# Patient Record
Sex: Female | Born: 1985 | Race: White | Hispanic: No | Marital: Married | Smoking: Former smoker
Health system: Southern US, Community
[De-identification: ages and names within clinical notes are randomized; demographics above are authoritative.]

## PROBLEM LIST (undated history)

## (undated) DIAGNOSIS — F329 Major depressive disorder, single episode, unspecified: Secondary | ICD-10-CM

## (undated) DIAGNOSIS — F32A Depression, unspecified: Secondary | ICD-10-CM

## (undated) DIAGNOSIS — E079 Disorder of thyroid, unspecified: Secondary | ICD-10-CM

## (undated) DIAGNOSIS — N200 Calculus of kidney: Secondary | ICD-10-CM

## (undated) DIAGNOSIS — F988 Other specified behavioral and emotional disorders with onset usually occurring in childhood and adolescence: Secondary | ICD-10-CM

## (undated) HISTORY — DX: Depression, unspecified: F32.A

## (undated) HISTORY — DX: Calculus of kidney: N20.0

## (undated) HISTORY — DX: Major depressive disorder, single episode, unspecified: F32.9

## (undated) HISTORY — DX: Other specified behavioral and emotional disorders with onset usually occurring in childhood and adolescence: F98.8

## (undated) HISTORY — DX: Disorder of thyroid, unspecified: E07.9

---
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
## 2005-03-09 ENCOUNTER — Other Ambulatory Visit: Admission: RE | Admit: 2005-03-09 | Discharge: 2005-03-09 | Payer: Self-pay | Admitting: Obstetrics and Gynecology

## 2005-03-09 ENCOUNTER — Other Ambulatory Visit: Admission: RE | Admit: 2005-03-09 | Discharge: 2005-03-09 | Payer: Self-pay | Admitting: Obstetrics & Gynecology

## 2006-01-12 ENCOUNTER — Emergency Department (HOSPITAL_COMMUNITY): Admission: EM | Admit: 2006-01-12 | Discharge: 2006-01-12 | Payer: Self-pay | Admitting: Emergency Medicine

## 2007-10-10 IMAGING — CT CT PELVIS W/O CM
1 of 3 series · 15 of 36 positions shown, 19 images · IV contrast (agent unspecified)
Comparison: none

CLINICAL DATA: Left flank pain.  Microhematuria for one day.  No previous surgery.  
 ABDOMEN CT WITHOUT CONTRAST:
TECHNIQUE: Multidetector CT imaging of the abdomen was performed following the standard protocol without IV contrast.
TECHNIQUE: Multidetector CT imaging of the pelvis was performed following the standard protocol without IV contrast.

[Series 2: renal stone · axial · 0.70mm/px · z∈[-441,-91]mm · 15 of 78 slices shown, 19 images]
[im 5/78  soft-tissue]
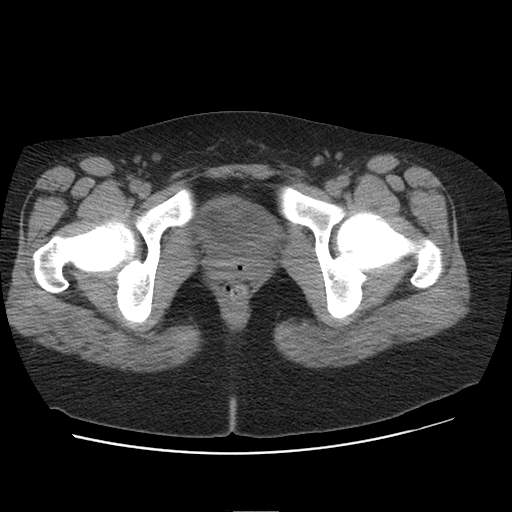
[im 5/78  bone]
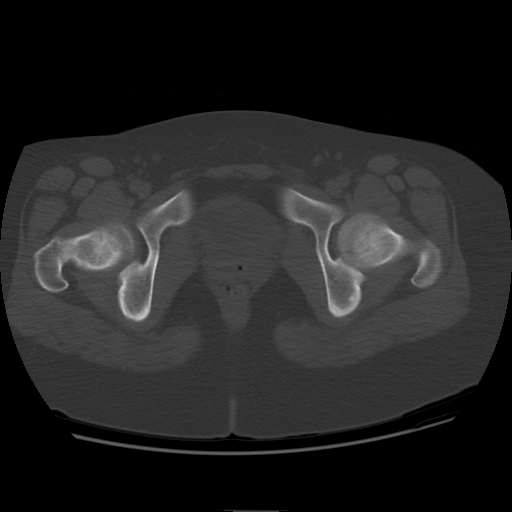
[im 10/78  soft-tissue]
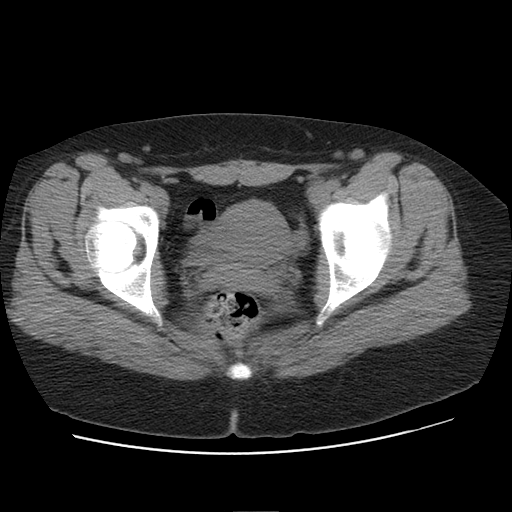
[im 15/78  soft-tissue]
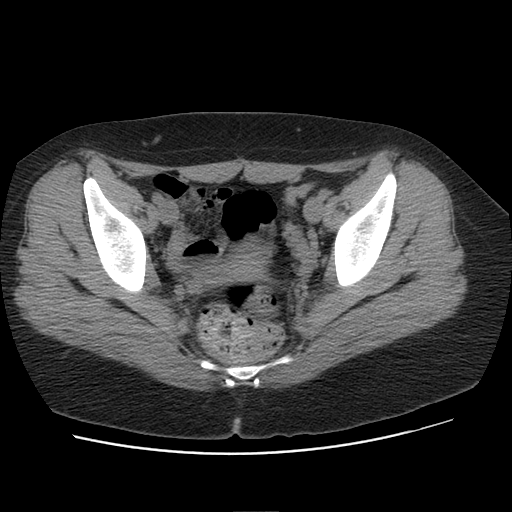
[im 23/78  soft-tissue]
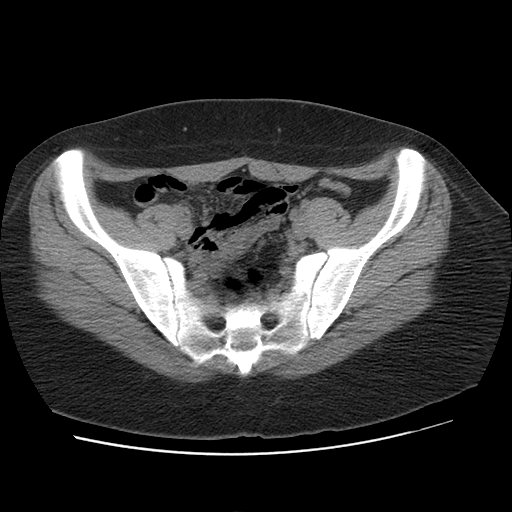
[im 28/78  soft-tissue]
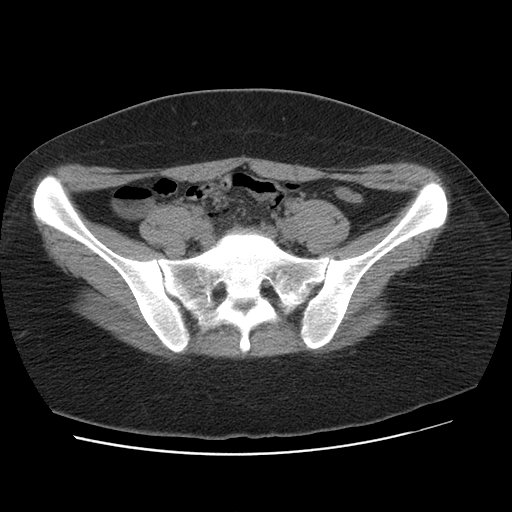
[im 33/78  soft-tissue]
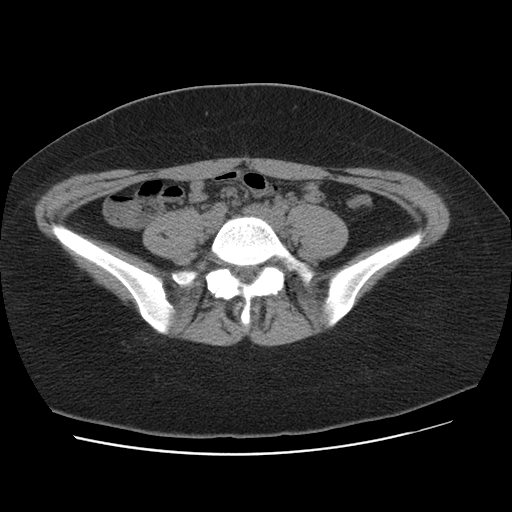
[im 40/78  soft-tissue]
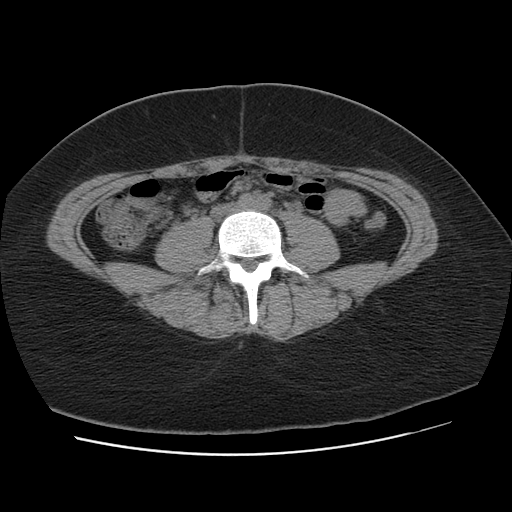
[im 45/78  soft-tissue]
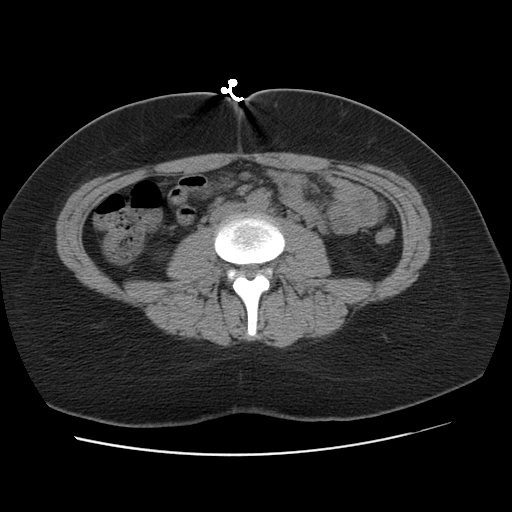
[im 50/78  soft-tissue]
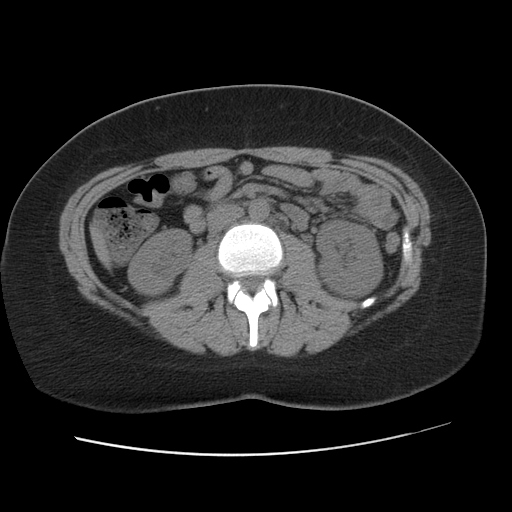
[im 50/78  bone]
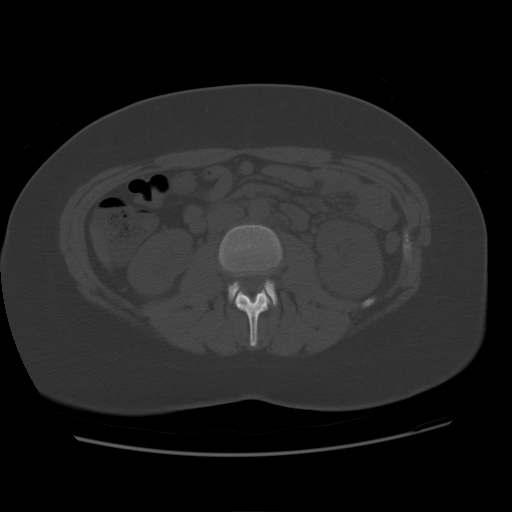
[im 55/78  soft-tissue]
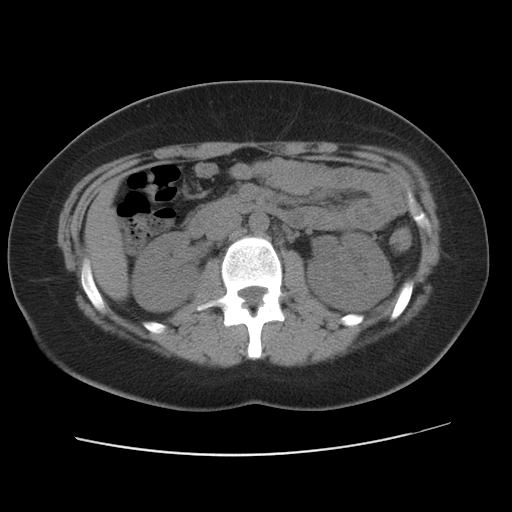
[im 63/78  soft-tissue]
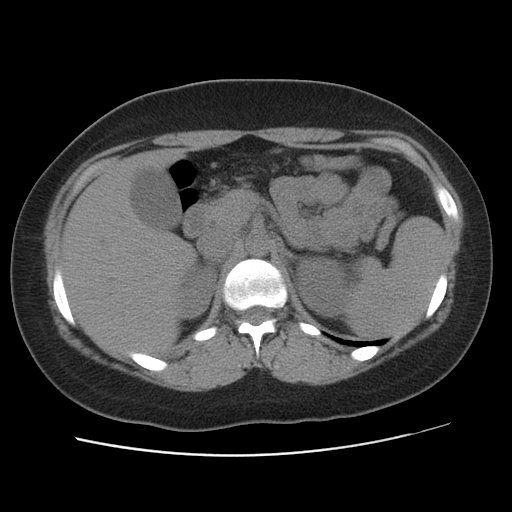
[im 68/78  soft-tissue]
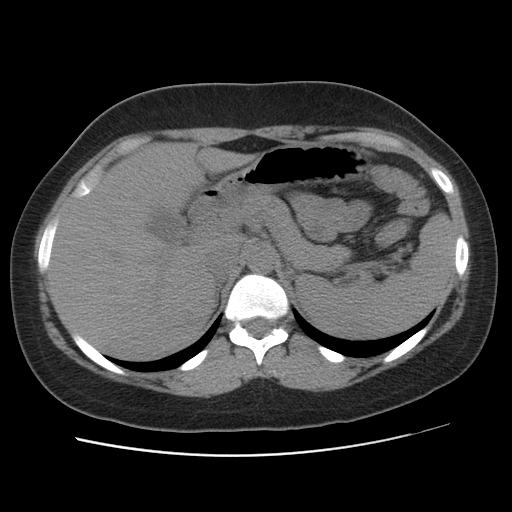
[im 68/78  lung]
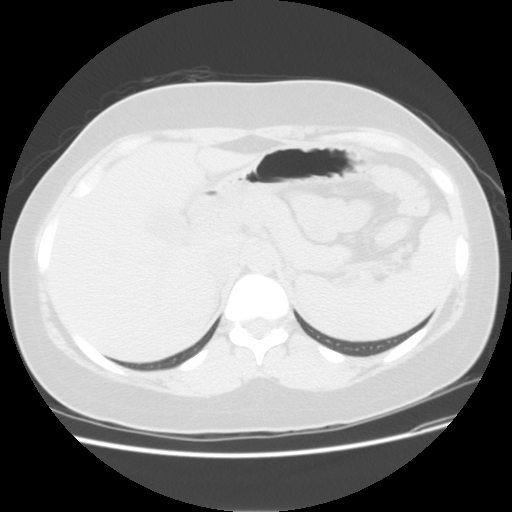
[im 70/78  lung]
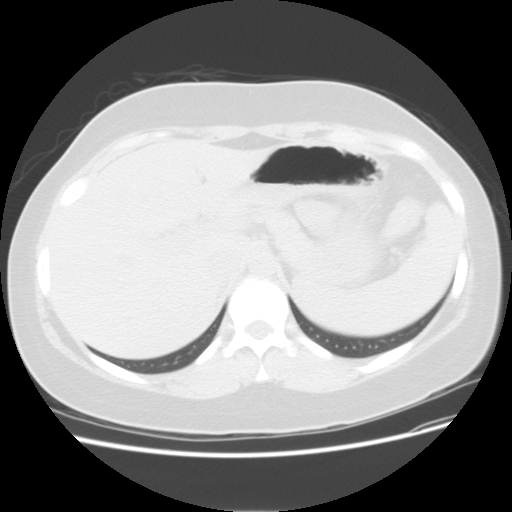
[im 73/78  soft-tissue]
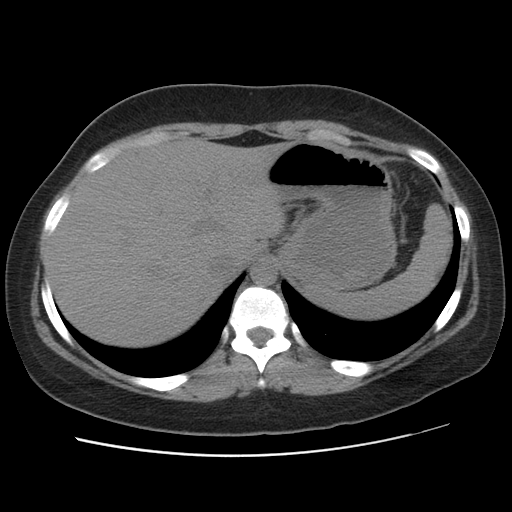
[im 73/78  lung]
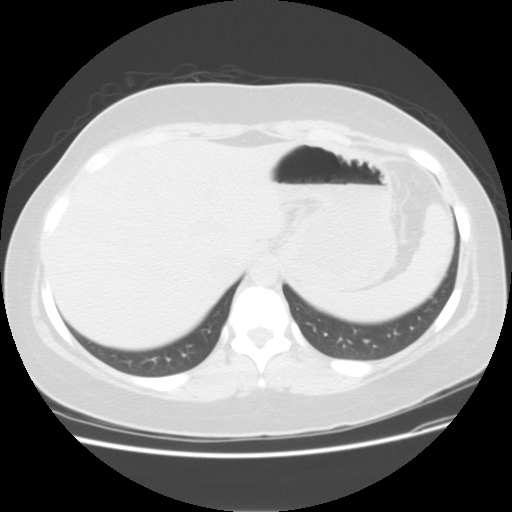
[im 75/78  lung]
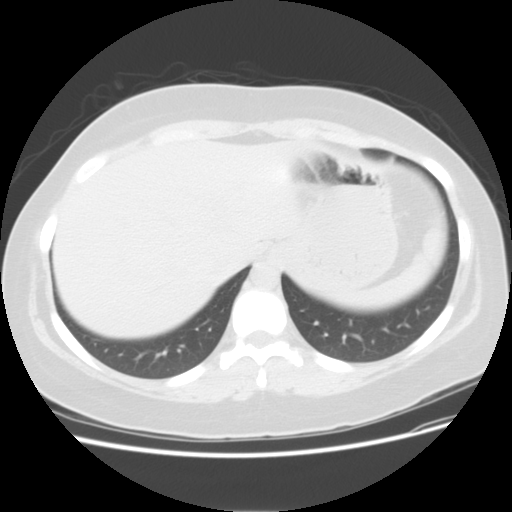

[15 of 36 positions shown; findings below may reference images not displayed]

FINDINGS: A series of scans of the abdomen without contrast show the lower lung fields to be normal.  The gallbladder, liver, common bile duct, pancreas, and spleen are normal.  The adrenal glands are normal.  The kidneys are normal except that the left is slightly larger than the right and may have minimal prominence of the collecting system.  No renal stone or obstruction is seen.  There is no hydroureter present.  No free fluid or air is seen within the abdomen.  The bowel appears normal.  The bones of the lower thoracic and upper lumbar spine are normal.
IMPRESSION: The left renal collecting system is minimally more prominent than the right and the left kidney is also slightly larger than the right.  No evidence of obstructing stone or mass is seen in either kidney.  
 PELVIS CT WITHOUT CONTRAST:
FINDINGS: Moderate amount of fecal material in the rectum without a fecal impaction.  The uterus and ovaries appear normal.  No free fluid or air is seen within the pelvis.  The appendix is not definitely identified but there is no inflammation in the region of the pelvis.  The bladder is partially filled and appears normal as far as can be seen.  The bones of the pelvis and lower lumbar spine appear normal.
IMPRESSION: Normal CT scan of the pelvis without contrast.

## 2009-11-05 ENCOUNTER — Encounter: Payer: Self-pay | Admitting: Family Medicine

## 2010-03-11 ENCOUNTER — Ambulatory Visit: Payer: Self-pay | Admitting: Family Medicine

## 2010-03-11 DIAGNOSIS — Z87898 Personal history of other specified conditions: Secondary | ICD-10-CM

## 2010-03-11 DIAGNOSIS — E039 Hypothyroidism, unspecified: Secondary | ICD-10-CM

## 2010-03-11 DIAGNOSIS — F3289 Other specified depressive episodes: Secondary | ICD-10-CM | POA: Insufficient documentation

## 2010-03-11 DIAGNOSIS — F988 Other specified behavioral and emotional disorders with onset usually occurring in childhood and adolescence: Secondary | ICD-10-CM

## 2010-03-11 DIAGNOSIS — F329 Major depressive disorder, single episode, unspecified: Secondary | ICD-10-CM

## 2010-03-11 DIAGNOSIS — Z87442 Personal history of urinary calculi: Secondary | ICD-10-CM

## 2010-03-26 ENCOUNTER — Ambulatory Visit: Payer: Self-pay | Admitting: Family Medicine

## 2010-03-29 ENCOUNTER — Ambulatory Visit: Payer: Self-pay | Admitting: Family Medicine

## 2010-09-02 ENCOUNTER — Other Ambulatory Visit: Payer: Self-pay | Admitting: Family Medicine

## 2010-09-02 ENCOUNTER — Ambulatory Visit
Admission: RE | Admit: 2010-09-02 | Discharge: 2010-09-02 | Payer: Self-pay | Source: Home / Self Care | Attending: Family Medicine | Admitting: Family Medicine

## 2010-09-03 LAB — TSH: TSH: 5.14 u[IU]/mL (ref 0.35–5.50)

## 2010-09-14 NOTE — Assessment & Plan Note (Signed)
Summary: tb test/2pm/njr  Nurse Visit   Vitals Entered By: Duard Brady LPN (March 26, 2010 2:17 PM)  Allergies: No Known Drug Allergies  Immunizations Administered:  PPD Skin Test:    Vaccine Type: PPD    Site: left forearm    Mfr: Sanofi Pasteur    Dose: 0.1 ml    Route: ID    Given by: Duard Brady LPN    Exp. Date: 06/17/2011    Lot #: [id number]    Physician counseled: yes  Orders Added: 1)  TB Skin Test [86580] 2)  Admin 1st Vaccine [phone]

## 2010-09-14 NOTE — Assessment & Plan Note (Signed)
Summary: Nancy Stevens//ccm/pt rsc/cjr   Vital Signs:  Patient profile:   25 year old female Menstrual status:  regular LMP:     03/05/2010 Height:      65.50 inches Weight:      240 pounds BMI:     39.47 Temp:     98 degrees F oral Pulse rate:   80 / minute Pulse rhythm:   regular Resp:     12 per minute BP sitting:   130 / 90  (left arm) Cuff size:   large  Vitals Entered By: Sid Falcon LPN (March 11, 2010 2:41 PM)  Nutrition Counseling: Patient's BMI is greater than 25 and therefore counseled on weight management options.  Serial Vital Signs/Assessments:  Time      Position  BP       Pulse  Resp  Temp     By                     120/80                         Evelena Peat MD  CC: new to establisn LMP (date): 03/05/2010     Menstrual Status regular Enter LMP: 03/05/2010   History of Present Illness: Patient seen new to establish care.  Past medical history reviewed. She has remote history of depression, history of kidney stones, migraine headaches, hypothyroidism, and history of ADD. Recently went to endocrinologist would like to transfer care here. She had blood work about one month ago and was told her blood levels (for thyroid) were " borderline".  She takes levothyroxine 112 micrograms daily.  History of ADD. She intends to start back to school this fall and would like to go back on Adderall XR 30 mg which she's taken previously. She does not recall any prior side effects.  Family history significant for hypertension and hyperlipidemia. Her father had melanoma.  Patient smokes 4 cigarettes per day. Rare alcohol use.  Preventive Screening-Counseling & Management  Alcohol-Tobacco     Smoking Status: current     Year Started: 2004  Caffeine-Diet-Exercise     Does Patient Exercise: yes  Allergies (verified): No Known Drug Allergies  Past History:  Family History: Last updated: 03/11/2010 stroke, hypertensin, heart disease, paternal grandmother type ll  diabetes, CHF, maternal grandfather melanoma, father  Social History: Last updated: 03/11/2010 Occupation:  Habilitation Tech Single Current Smoker Alcohol use-yes Regular exercise-yes  Risk Factors: Exercise: yes (03/11/2010)  Risk Factors: Smoking Status: current (03/11/2010)  Past Medical History: Depression kidney stones migraines hypothyroidism ADD PMH-FH-SH reviewed for relevance  Family History: stroke, hypertensin, heart disease, paternal grandmother type ll diabetes, CHF, maternal grandfather melanoma, father  Social History: Occupation:  Herbalist Single Current Smoker Alcohol use-yes Regular exercise-yes Occupation:  employed Smoking Status:  current Does Patient Exercise:  yes  Review of Systems  The patient denies anorexia, fever, vision loss, decreased hearing, hoarseness, chest pain, syncope, dyspnea on exertion, peripheral edema, prolonged cough, headaches, hemoptysis, abdominal pain, melena, hematochezia, severe indigestion/heartburn, incontinence, muscle weakness, and depression.         has lost 20 pounds past few months with exercise.  Physical Exam  General:  Well-developed,well-nourished,in no acute distress; alert,appropriate and cooperative throughout examination Eyes:  pupils equal, pupils round, and pupils reactive to light.   Ears:  External ear exam shows no significant lesions or deformities.  Otoscopic examination reveals clear canals, tympanic membranes are intact bilaterally without  bulging, retraction, inflammation or discharge. Hearing is grossly normal bilaterally. Mouth:  Oral mucosa and oropharynx without lesions or exudates.  Teeth in good repair. Neck:  No deformities, masses, or tenderness noted. Lungs:  Normal respiratory effort, chest expands symmetrically. Lungs are clear to auscultation, no crackles or wheezes. Heart:  Normal rate and regular rhythm. S1 and S2 normal without gallop, murmur, click, rub or other  extra sounds. Extremities:  No clubbing, cyanosis, edema, or deformity noted with normal full range of motion of all joints.   Neurologic:  alert & oriented X3, cranial nerves II-XII intact, and strength normal in all extremities.   Skin:  no rashes and no suspicious lesions.   Psych:  normally interactive, good eye contact, not anxious appearing, and not depressed appearing.     Impression & Recommendations:  Problem # 1:  HYPOTHYROIDISM (ICD-244.9) send for recent labs. Her updated medication list for this problem includes:    Levothroid 112 Mcg Tabs (Levothyroxine sodium) ..... Once daily  Problem # 2:  ADD (ICD-314.00) Patient plans to start back to school this fall. She requests going back on Adderall which has been on previously with good tolerance. Refills given  Problem # 3:  RENAL CALCULUS, HX OF (ICD-V13.01)  Problem # 4:  MIGRAINES, HX OF (ICD-V13.8)  Complete Medication List: 1)  Levothroid 112 Mcg Tabs (Levothyroxine sodium) .... Once daily 2)  Amphetamine-dextroamphetamine 30 Mg Xr24h-cap (Amphetamine-dextroamphetamine) .... One by mouth once daily 3)  Amphetamine-dextroamphetamine 30 Mg Xr24h-cap (Amphetamine-dextroamphetamine) .... One by mouth once daily may refill in one month 4)  Amphetamine-dextroamphetamine 30 Mg Xr24h-cap (Amphetamine-dextroamphetamine) .... One by mouth once daily may refill in two months.  Patient Instructions: 1)  It is important that you exercise reguarly at least 20 minutes 5 times a week. If you develop chest pain, have severe difficulty breathing, or feel very tired, stop exercising immediately and seek medical attention.  2)  You need to lose weight. Consider a lower calorie diet and regular exercise.  3)  Check your  Blood Pressure regularly . If it is above:140/90   you should make an appointment. Prescriptions: AMPHETAMINE-DEXTROAMPHETAMINE 30 MG XR24H-CAP (AMPHETAMINE-DEXTROAMPHETAMINE) one by mouth once daily may refill in two  months.  #30 x 0   Entered and Authorized by:   Evelena Peat MD   Signed by:   Evelena Peat MD on 03/11/2010   Method used:   Print then Give to Patient   RxID:   [id number] AMPHETAMINE-DEXTROAMPHETAMINE 30 MG XR24H-CAP (AMPHETAMINE-DEXTROAMPHETAMINE) one by mouth once daily may refill in one month  #30 x 0   Entered and Authorized by:   Evelena Peat MD   Signed by:   Evelena Peat MD on 03/11/2010   Method used:   Print then Give to Patient   RxID:   [id number] AMPHETAMINE-DEXTROAMPHETAMINE 30 MG XR24H-CAP (AMPHETAMINE-DEXTROAMPHETAMINE) one by mouth once daily  #30 x 0   Entered and Authorized by:   Evelena Peat MD   Signed by:   Evelena Peat MD on 03/11/2010   Method used:   Print then Give to Patient   RxID:   [id number]

## 2010-09-14 NOTE — Letter (Signed)
Summary: TB Skin Test  All     ,     Phone:   Fax:           TB Skin Test    Nancy Stevens    Date TB Test Placed:  ________________  L or R forearm  TB Test Placed by:  ___________________  Lot #:  __________________        Expiration Date: _____________  Date TB Test Read:  ____________________    Result ___________MM  TB Test Read by:  _______________

## 2010-09-14 NOTE — Assessment & Plan Note (Signed)
Summary: PD READING/RCD  Nurse Visit   Allergies: No Known Drug Allergies  PPD Results    Date of reading: 03/29/2010    Results: < 5mm    Interpretation: negative

## 2010-09-16 NOTE — Assessment & Plan Note (Signed)
Summary: check thyroid/cdw   Vital Signs:  Patient profile:   25 year old female Menstrual status:  regular Weight:      251 pounds Temp:     98.3 degrees F oral Pulse rate:   80 / minute Pulse rhythm:   regular BP sitting:   118 / 90  (left arm) Cuff size:   large  Vitals Entered By: Alfred Levins, CMA (September 02, 2010 11:12 AM) CC: check thyroid   History of Present Illness: Followup hypothyroidism and ADD.  Patient has rarely taken Adderall during the past year because of high deductible. She will start back at this time. Dramatic improvement in school and work performance when she takes medication. No side effects.  Takes Levothroid 112 micrograms daily for hypothyroidism. Compliant with medication. Needs repeat lab work.  Current Medications (verified): 1)  Levothroid 112 Mcg Tabs (Levothyroxine Sodium) .... Once Daily 2)  Amphetamine-Dextroamphetamine 30 Mg Xr24h-Cap (Amphetamine-Dextroamphetamine) .... One By Mouth Once Daily 3)  Amphetamine-Dextroamphetamine 30 Mg Xr24h-Cap (Amphetamine-Dextroamphetamine) .... One By Mouth Once Daily May Refill in One Month 4)  Amphetamine-Dextroamphetamine 30 Mg Xr24h-Cap (Amphetamine-Dextroamphetamine) .... One By Mouth Once Daily May Refill in Two Months.  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  Teeth in good repair. Neck:  No deformities, masses, or tenderness noted. Lungs:  Normal respiratory effort, chest expands symmetrically. Lungs are clear to auscultation, no crackles or wheezes. Heart:  Normal rate and regular rhythm. S1 and S2 normal without gallop, murmur, click, rub or other extra sounds.   Impression & Recommendations:  Problem # 1:  HYPOTHYROIDISM (ICD-244.9)  Her updated medication list for this problem includes:    Levothroid 112 Mcg Tabs (Levothyroxine sodium) ..... Once daily  Orders: Specimen Handling (04540) Venipuncture (98119) TLB-TSH (Thyroid Stimulating Hormone) (84443-TSH)  Problem # 2:  ADD (ICD-314.00)  Complete Medication List: 1)  Levothroid 112 Mcg Tabs (Levothyroxine sodium) .... Once daily 2)  Amphetamine-dextroamphetamine 30 Mg Xr24h-cap (Amphetamine-dextroamphetamine) .... One by mouth once daily 3)  Amphetamine-dextroamphetamine 30 Mg Xr24h-cap (Amphetamine-dextroamphetamine) .... One by mouth once daily may refill in one month 4)  Amphetamine-dextroamphetamine 30 Mg Xr24h-cap (Amphetamine-dextroamphetamine) .... One by mouth once daily may refill in two months. Prescriptions: LEVOTHROID 112 MCG TABS (LEVOTHYROXINE SODIUM) once daily  #90 x 3   Entered by:   Alfred Levins, CMA   Authorized by:   Evelena Peat MD   Signed by:   Alfred Levins, CMA on 09/02/2010   Method used:   Electronically to        [facility] [address] [phone]* (retail)       [address]       [place], [place]  [address]       Ph: [phone] or [phone]       Fax: [phone]   RxID:   [id number] AMPHETAMINE-DEXTROAMPHETAMINE 30 MG XR24H-CAP (AMPHETAMINE-DEXTROAMPHETAMINE) one by mouth once daily may refill in two months.  #30  x 0   Entered by:   Alfred Levins, CMA   Authorized by:   Evelena Peat MD   Signed by:   Alfred Levins, CMA on  09/02/2010   Method used:   Print then Give to Patient   RxID:   [id number] AMPHETAMINE-DEXTROAMPHETAMINE 30 MG XR24H-CAP (AMPHETAMINE-DEXTROAMPHETAMINE) one by mouth once daily may refill in one month  #30 x 0   Entered by:   Alfred Levins, CMA   Authorized by:   Evelena Peat MD   Signed by:   Alfred Levins, CMA on 09/02/2010   Method used:   Print then Give to Patient   RxID:   [id number] AMPHETAMINE-DEXTROAMPHETAMINE 30 MG XR24H-CAP (AMPHETAMINE-DEXTROAMPHETAMINE) one by mouth once daily  #30 x 0   Entered by:   Alfred Levins, CMA   Authorized by:   Evelena Peat MD   Signed by:   Alfred Levins, CMA on 09/02/2010   Method used:   Print then Give to Patient   RxID:   [id number]    Orders Added: 1)  Specimen Handling [99000] 2)  Venipuncture [16010] 3)  TLB-TSH (Thyroid Stimulating Hormone) [84443-TSH] 4)  Est. Patient Level III [93235]

## 2010-09-17 NOTE — Consult Note (Signed)
Summary: [facility]   Imported By: Maryln Gottron 03/29/2010 14:45:57  _____________________________________________________________________  External Attachment:    Type:   Image     Comment:   External Document

## 2010-12-22 ENCOUNTER — Other Ambulatory Visit: Payer: Self-pay | Admitting: Family Medicine

## 2010-12-22 MED ORDER — AMPHETAMINE-DEXTROAMPHET ER 30 MG PO CP24: 30.0000 mg | ORAL_CAPSULE | ORAL | Status: DC

## 2010-12-22 NOTE — Telephone Encounter (Signed)
Last filled 09/02/10 #30 with 2 refills

## 2010-12-22 NOTE — Telephone Encounter (Signed)
Pt called req script for generic Adderall XR 30 mg - 3 scripts.

## 2010-12-22 NOTE — Telephone Encounter (Signed)
Ok to refill 

## 2010-12-22 NOTE — Telephone Encounter (Signed)
Rx printed, signed pt informed ready for pick-up on VM

## 2011-03-23 ENCOUNTER — Other Ambulatory Visit: Payer: Self-pay | Admitting: Family Medicine

## 2011-03-23 NOTE — Telephone Encounter (Signed)
Refill for 3 months. 

## 2011-03-23 NOTE — Telephone Encounter (Signed)
Pt is coming Fri 8/10 for a TB injection. She would like a Rx for her Adderall as well, and hopes to pick it up Friday when she is here at 9:15.

## 2011-03-23 NOTE — Telephone Encounter (Signed)
Please advise 

## 2011-03-24 ENCOUNTER — Encounter: Payer: Self-pay | Admitting: Family Medicine

## 2011-03-24 MED ORDER — AMPHETAMINE-DEXTROAMPHET ER 30 MG PO CP24: 30.0000 mg | ORAL_CAPSULE | ORAL | Status: DC

## 2011-03-24 NOTE — Telephone Encounter (Signed)
Pt informed ready for pick up 

## 2011-03-24 NOTE — Telephone Encounter (Signed)
Addended by: Melchor Amour on: 03/24/2011 11:45 AM   Modules accepted: Orders

## 2011-03-25 ENCOUNTER — Ambulatory Visit: Payer: BC Managed Care – PPO | Admitting: Family Medicine

## 2011-03-25 DIAGNOSIS — Z111 Encounter for screening for respiratory tuberculosis: Secondary | ICD-10-CM

## 2011-03-25 DIAGNOSIS — Z299 Encounter for prophylactic measures, unspecified: Secondary | ICD-10-CM

## 2011-03-25 NOTE — Telephone Encounter (Signed)
OK to refill

## 2011-03-28 ENCOUNTER — Telehealth: Payer: Self-pay | Admitting: Family Medicine

## 2011-03-28 LAB — TB SKIN TEST: Induration: <5

## 2011-03-30 NOTE — Telephone Encounter (Signed)
Opened in error

## 2011-07-11 ENCOUNTER — Telehealth: Payer: Self-pay | Admitting: Family Medicine

## 2011-07-11 MED ORDER — AMPHETAMINE-DEXTROAMPHET ER 30 MG PO CP24: 30.0000 mg | ORAL_CAPSULE | ORAL | Status: DC

## 2011-07-11 NOTE — Telephone Encounter (Signed)
Refill once but pt needs office follow up.

## 2011-07-11 NOTE — Telephone Encounter (Signed)
Pt last seen 1.19.12. Pls advise.  

## 2011-07-11 NOTE — Telephone Encounter (Signed)
Left a detailed vm for pt. 

## 2011-07-11 NOTE — Telephone Encounter (Signed)
Refill Adderall x 3 rxs. Thanks.

## 2011-08-03 ENCOUNTER — Ambulatory Visit (INDEPENDENT_AMBULATORY_CARE_PROVIDER_SITE_OTHER): Payer: BC Managed Care – PPO | Admitting: Family Medicine

## 2011-08-03 ENCOUNTER — Encounter: Payer: Self-pay | Admitting: Family Medicine

## 2011-08-03 DIAGNOSIS — E039 Hypothyroidism, unspecified: Secondary | ICD-10-CM

## 2011-08-03 DIAGNOSIS — F988 Other specified behavioral and emotional disorders with onset usually occurring in childhood and adolescence: Secondary | ICD-10-CM

## 2011-08-03 DIAGNOSIS — R030 Elevated blood-pressure reading, without diagnosis of hypertension: Secondary | ICD-10-CM

## 2011-08-03 LAB — TSH: TSH: 3.3 u[IU]/mL (ref 0.35–5.50)

## 2011-08-03 MED ORDER — LEVOTHYROXINE SODIUM 112 MCG PO TABS: 112.0000 ug | ORAL_TABLET | Freq: Every day | ORAL | Status: DC

## 2011-08-03 NOTE — Patient Instructions (Signed)
Work on weight loss and monitor blood pressure and be in touch if consistently over 140/90

## 2011-08-03 NOTE — Progress Notes (Signed)
  Subjective:    Patient ID: Nancy Stevens, female    DOB: [birth year], 25 y.o.   MRN: [id number]  HPI  Medical followup. History of hypothyroidism, past history depression, and ADD. Symptoms well controlled on Adderall. No side effects. Takes Synthroid 112 mcg hypothyroidism. Compliant with therapy. Denies any fatigue. No consistent exercise. Had some steady weight gain in recent years.  Elevated blood pressure today. No history of hypertension diagnosis. No recent headaches. No dyspnea. No peripheral edema issues.  Past Medical History  Diagnosis Date  . Depression   . Kidney stone   . Migraine   . Thyroid disease   . ADD (attention deficit disorder)    No past surgical history on file.  reports that she has been smoking.  She does not have any smokeless tobacco history on file. Her alcohol and drug histories not on file. family history includes Cancer in her other; Diabetes in her paternal grandfather; Heart disease in her other and paternal grandfather; Hypertension in her other; and Stroke in her other. No Known Allergies    Review of Systems  Constitutional: Negative for fever, activity change and appetite change.  Respiratory: Negative for cough and shortness of breath.   Cardiovascular: Negative for chest pain, palpitations and leg swelling.  Gastrointestinal: Negative for abdominal pain.  Genitourinary: Negative for dysuria.  Neurological: Negative for dizziness and headaches.  Hematological: Negative for adenopathy.       Objective:   Physical Exam  Constitutional: She is oriented to person, place, and time. She appears well-developed and well-nourished. No distress.  HENT:  Mouth/Throat: Oropharynx is clear and moist.  Neck: Neck supple. No thyromegaly present.  Cardiovascular: Normal rate and regular rhythm.   No murmur heard. Pulmonary/Chest: Effort normal and breath sounds normal. No respiratory distress. She has no wheezes. She has no rales.    Musculoskeletal: She exhibits no edema.  Neurological: She is alert and oriented to person, place, and time.          Assessment & Plan:  #1 hypothyroidism. Recheck TSH. Refill medication for one year #2 attention deficit disorder. Patient will need refills of medication in one month. Monitor blood pressure closely #3 elevated blood pressure. No diagnosis of hypertension. Strongly recommend weight loss and more consistent exercise and close home monitoring. If diastolic blood pressure still elevated at followup in 6 months consider initiating medication

## 2011-08-04 NOTE — Progress Notes (Signed)
Quick Note:  Pt informed ______ 

## 2011-08-31 ENCOUNTER — Other Ambulatory Visit: Payer: Self-pay | Admitting: Family Medicine

## 2011-08-31 NOTE — Telephone Encounter (Signed)
Pt need 3 separate rx for generic adderall xr 30 mg

## 2011-08-31 NOTE — Telephone Encounter (Signed)
Refill OK

## 2011-09-01 MED ORDER — AMPHETAMINE-DEXTROAMPHET ER 30 MG PO CP24: 30.0000 mg | ORAL_CAPSULE | ORAL | Status: DC

## 2011-09-01 NOTE — Telephone Encounter (Signed)
Pt informed 3 RX ready to pick-up on VM

## 2011-11-02 ENCOUNTER — Other Ambulatory Visit: Payer: Self-pay | Admitting: Family Medicine

## 2011-12-12 ENCOUNTER — Other Ambulatory Visit: Payer: Self-pay | Admitting: Family Medicine

## 2011-12-12 NOTE — Telephone Encounter (Signed)
Refill ok? 

## 2011-12-12 NOTE — Telephone Encounter (Signed)
adderall last filled 09-01-11, #30 with  refills

## 2011-12-12 NOTE — Telephone Encounter (Signed)
Pt needs new rx generic adderall 30 mg °

## 2011-12-13 MED ORDER — AMPHETAMINE-DEXTROAMPHET ER 30 MG PO CP24: 30.0000 mg | ORAL_CAPSULE | ORAL | Status: DC

## 2011-12-13 NOTE — Telephone Encounter (Signed)
Pt informed readv for pick-up

## 2012-02-01 ENCOUNTER — Encounter: Payer: Self-pay | Admitting: Family Medicine

## 2012-02-01 ENCOUNTER — Ambulatory Visit (INDEPENDENT_AMBULATORY_CARE_PROVIDER_SITE_OTHER): Payer: BC Managed Care – PPO | Admitting: Family Medicine

## 2012-02-01 VITALS — BP 120/82 | Temp 98.0°F | Wt 230.0 lb

## 2012-02-01 DIAGNOSIS — E039 Hypothyroidism, unspecified: Secondary | ICD-10-CM

## 2012-02-01 DIAGNOSIS — F988 Other specified behavioral and emotional disorders with onset usually occurring in childhood and adolescence: Secondary | ICD-10-CM

## 2012-02-01 NOTE — Progress Notes (Signed)
  Subjective:    Patient ID: Nancy Stevens, female    DOB: 23-Jun-1986, 26 y.o.   MRN: [id number]  HPI  Patient seen for medical followup. Has hypothyroidism treated with levothyroxin. Compliant with medication. TSH at goal last December. ADD treated with Adderall. Working well. She's combining school and work at this time. Denies any insomnia. No headaches. No chest pains. She has made some significant diet changes and has lost about 20 pounds since last visit. Also exercising more. Feels good overall. No depressive symptoms.   Review of Systems  Constitutional: Negative for appetite change and fatigue.  Respiratory: Negative for shortness of breath.   Cardiovascular: Negative for chest pain.  Neurological: Negative for dizziness and headaches.       Objective:   Physical Exam  Constitutional: She appears well-developed and well-nourished.  Neck: Neck supple. No thyromegaly present.  Cardiovascular: Normal rate and regular rhythm.   Pulmonary/Chest: Effort normal and breath sounds normal. No respiratory distress. She has no wheezes. She has no rales.  Lymphadenopathy:    She has no cervical adenopathy.          Assessment & Plan:  #1 hypothyroidism. Stable by recent labs. Check TSH at followup in 6 months #2 ADD stable. Continue Adderall XR. Continue weight loss efforts.

## 2012-03-22 ENCOUNTER — Telehealth: Payer: Self-pay | Admitting: Family Medicine

## 2012-03-22 NOTE — Telephone Encounter (Signed)
Refill for 3 months. 

## 2012-03-22 NOTE — Telephone Encounter (Signed)
Pt called and is req 3 month script for amphetamine-dextroamphetamine (ADDERALL XR) 30 MG 24 hr capsule

## 2012-03-22 NOTE — Telephone Encounter (Signed)
adderall last filled 12-13-11, #30 X 2

## 2012-03-23 MED ORDER — AMPHETAMINE-DEXTROAMPHET ER 30 MG PO CP24: 30.0000 mg | ORAL_CAPSULE | ORAL | Status: DC

## 2012-03-23 NOTE — Telephone Encounter (Signed)
rx up front ready for p/u, pt aware 

## 2012-03-26 ENCOUNTER — Ambulatory Visit (INDEPENDENT_AMBULATORY_CARE_PROVIDER_SITE_OTHER): Payer: BC Managed Care – PPO | Admitting: Family Medicine

## 2012-03-26 DIAGNOSIS — Z111 Encounter for screening for respiratory tuberculosis: Secondary | ICD-10-CM

## 2012-03-26 DIAGNOSIS — Z299 Encounter for prophylactic measures, unspecified: Secondary | ICD-10-CM

## 2012-06-29 ENCOUNTER — Telehealth: Payer: Self-pay | Admitting: Family Medicine

## 2012-06-29 MED ORDER — AMPHETAMINE-DEXTROAMPHET ER 30 MG PO CP24: 30.0000 mg | ORAL_CAPSULE | ORAL | Status: DC

## 2012-06-29 NOTE — Telephone Encounter (Signed)
Refill OK

## 2012-06-29 NOTE — Telephone Encounter (Signed)
Pt called req 3 scripts for amphetamine-dextroamphetamine (ADDERALL XR) 30 MG 24 hr capsule Take 1 capsule (30 mg total) by mouth every morning.

## 2012-06-29 NOTE — Telephone Encounter (Signed)
Adderall last filled X three 03-23-12

## 2012-06-29 NOTE — Telephone Encounter (Signed)
Pt informed on VM ready to pick-up 

## 2012-08-02 ENCOUNTER — Ambulatory Visit (INDEPENDENT_AMBULATORY_CARE_PROVIDER_SITE_OTHER): Payer: BC Managed Care – PPO | Admitting: Family Medicine

## 2012-08-02 ENCOUNTER — Encounter: Payer: Self-pay | Admitting: Family Medicine

## 2012-08-02 VITALS — BP 120/80 | Temp 98.0°F | Wt 230.0 lb

## 2012-08-02 DIAGNOSIS — E039 Hypothyroidism, unspecified: Secondary | ICD-10-CM

## 2012-08-02 LAB — TSH: TSH: 1.86 u[IU]/mL (ref 0.35–5.50)

## 2012-08-02 NOTE — Progress Notes (Signed)
  Subjective:    Patient ID: Nancy Stevens, female    DOB: 1985-09-01, 26 y.o.   MRN: [id number]  HPI  Patient seen for followup hypothyroidism. Compliant with therapy. No fatigue issues. Mood stable with no recurrent depression. She has history of ADD treated with Adderall. Stable with that as well. No appetite or weight changes. Needs followup lab work. Denies any constipation, cold intolerance. She does have a couple patches of dry skin on her inner elbows  Past Medical History  Diagnosis Date  . Depression   . Kidney stone   . Migraine   . Thyroid disease   . ADD (attention deficit disorder)    No past surgical history on file.  reports that she has been smoking.  She does not have any smokeless tobacco history on file. Her alcohol and drug histories not on file. family history includes Cancer in her other; Diabetes in her paternal grandfather; Heart disease in her other and paternal grandfather; Hypertension in her other; and Stroke in her other. No Known Allergies    Review of Systems  Constitutional: Negative for appetite change, fatigue and unexpected weight change.  Respiratory: Negative for shortness of breath.   Cardiovascular: Negative for chest pain.       Objective:   Physical Exam  Constitutional: She appears well-developed and well-nourished.  Neck: Neck supple. No thyromegaly present.  Cardiovascular: Normal rate and regular rhythm.   Pulmonary/Chest: Effort normal and breath sounds normal. No respiratory distress. She has no wheezes. She has no rales.  Lymphadenopathy:    She has no cervical adenopathy.          Assessment & Plan:  Hypothyroidism. Recheck TSH. Continue to monitor this yearly.

## 2012-08-03 NOTE — Progress Notes (Signed)
Quick Note:  Pt informed on VM ______ 

## 2012-10-10 ENCOUNTER — Telehealth: Payer: Self-pay | Admitting: Family Medicine

## 2012-10-10 MED ORDER — AMPHETAMINE-DEXTROAMPHET ER 30 MG PO CP24: 30.0000 mg | ORAL_CAPSULE | ORAL | Status: DC

## 2012-10-10 NOTE — Telephone Encounter (Signed)
Pt informed Rx ready to pick up

## 2012-10-10 NOTE — Telephone Encounter (Signed)
Refill OK

## 2012-10-10 NOTE — Telephone Encounter (Signed)
Patient called stating that she need a refill of her adderall xr 30mg 1 poqd. Please assist.  °

## 2012-10-10 NOTE — Telephone Encounter (Signed)
adderall XR 30 daily last filled 06/29/12, #30 with 2 refills

## 2012-12-05 ENCOUNTER — Other Ambulatory Visit: Payer: Self-pay | Admitting: Family Medicine

## 2013-01-16 ENCOUNTER — Telehealth: Payer: Self-pay | Admitting: Family Medicine

## 2013-01-16 NOTE — Telephone Encounter (Signed)
Pt need new generic adderall xr 30 mg

## 2013-01-16 NOTE — Telephone Encounter (Signed)
Refill OK

## 2013-01-17 MED ORDER — AMPHETAMINE-DEXTROAMPHET ER 30 MG PO CP24: 30.0000 mg | ORAL_CAPSULE | ORAL | Status: DC

## 2013-01-17 NOTE — Addendum Note (Signed)
Addended by: Alfred Levins D on: 01/17/2013 07:50 AM   Modules accepted: Orders

## 2013-01-17 NOTE — Telephone Encounter (Signed)
rx up front ready for p/u, pt aware 

## 2013-01-31 ENCOUNTER — Ambulatory Visit: Payer: BC Managed Care – PPO | Admitting: Family Medicine

## 2013-02-06 ENCOUNTER — Encounter: Payer: Self-pay | Admitting: Family Medicine

## 2013-02-06 ENCOUNTER — Ambulatory Visit (INDEPENDENT_AMBULATORY_CARE_PROVIDER_SITE_OTHER): Payer: BC Managed Care – PPO | Admitting: Family Medicine

## 2013-02-06 VITALS — BP 115/70 | HR 76 | Temp 98.4°F | Resp 16 | Wt 235.0 lb

## 2013-02-06 DIAGNOSIS — E039 Hypothyroidism, unspecified: Secondary | ICD-10-CM

## 2013-02-06 DIAGNOSIS — F988 Other specified behavioral and emotional disorders with onset usually occurring in childhood and adolescence: Secondary | ICD-10-CM

## 2013-02-06 NOTE — Progress Notes (Signed)
  Subjective:    Patient ID: Nancy Stevens, female    DOB: 1985-09-23, 27 y.o.   MRN: [id number]  HPI Patient seen for followup regarding attention deficit disorder and hypothyroidism Compliant with medications. TSH was normal back in December.  Does not need any medications at this time. Generally doing well. Denies any fatigue issues. She's had some mild weight gain since last visit. Father had history of melanoma and patient needs skin check. No worrisome lesions  Past Medical History  Diagnosis Date  . Depression   . Kidney stone   . Migraine   . Thyroid disease   . ADD (attention deficit disorder)    No past surgical history on file.  reports that she has been smoking.  She does not have any smokeless tobacco history on file. Her alcohol and drug histories are not on file. family history includes Cancer in her other; Diabetes in her paternal grandfather; Heart disease in her other and paternal grandfather; Hypertension in her other; and Stroke in her other. No Known Allergies    Review of Systems  Constitutional: Negative for fatigue.  Respiratory: Negative for shortness of breath.   Endocrine: Negative for cold intolerance, polydipsia and polyuria.  Neurological: Negative for headaches.       Objective:   Physical Exam  Constitutional: She appears well-developed and well-nourished.  HENT:  Right Ear: External ear normal.  Left Ear: External ear normal.  Mouth/Throat: Oropharynx is clear and moist.  Neck: Neck supple. No thyromegaly present.  Cardiovascular: Normal rate and regular rhythm.   Pulmonary/Chest: Effort normal and breath sounds normal. No respiratory distress. She has no wheezes. She has no rales.  Musculoskeletal: She exhibits no edema.  Lymphadenopathy:    She has no cervical adenopathy.  Skin:  No concerning lesions          Assessment & Plan:  #1 attention deficit disorder. Stable. Continue Adderall XR 30 mg once daily #2 positive family  history of melanoma. No concerning lesions today on exam. Discussed prevention of sunburn #3 hypothyroidism. Recent TSH at goal. Recheck in 6 months

## 2013-03-09 ENCOUNTER — Other Ambulatory Visit: Payer: Self-pay | Admitting: Family Medicine

## 2013-05-02 ENCOUNTER — Telehealth: Payer: Self-pay | Admitting: Family Medicine

## 2013-05-02 MED ORDER — AMPHETAMINE-DEXTROAMPHET ER 30 MG PO CP24: 30.0000 mg | ORAL_CAPSULE | ORAL | Status: DC

## 2013-05-02 NOTE — Telephone Encounter (Signed)
Refill for 3 months OK. 

## 2013-05-02 NOTE — Telephone Encounter (Signed)
Left message that RX is at the front for pick up

## 2013-05-02 NOTE — Telephone Encounter (Signed)
Last refill 01/17/13 #30 no refill Last visit 02/06/13

## 2013-05-02 NOTE — Telephone Encounter (Signed)
Pt called to request a refill of her amphetamine-dextroamphetamine (ADDERALL XR) 30 MG 24 hr capsule. Please assist.

## 2013-08-02 ENCOUNTER — Encounter: Payer: Self-pay | Admitting: Family Medicine

## 2013-08-02 ENCOUNTER — Other Ambulatory Visit: Payer: Self-pay

## 2013-08-02 ENCOUNTER — Ambulatory Visit (INDEPENDENT_AMBULATORY_CARE_PROVIDER_SITE_OTHER): Payer: BC Managed Care – PPO | Admitting: Family Medicine

## 2013-08-02 VITALS — BP 110/80 | HR 89 | Temp 97.8°F | Wt 245.0 lb

## 2013-08-02 DIAGNOSIS — E039 Hypothyroidism, unspecified: Secondary | ICD-10-CM

## 2013-08-02 DIAGNOSIS — F988 Other specified behavioral and emotional disorders with onset usually occurring in childhood and adolescence: Secondary | ICD-10-CM

## 2013-08-02 LAB — TSH: TSH: 1.78 u[IU]/mL (ref 0.35–5.50)

## 2013-08-02 MED ORDER — AMPHETAMINE-DEXTROAMPHET ER 30 MG PO CP24: 30.0000 mg | ORAL_CAPSULE | ORAL | Status: DC

## 2013-08-02 NOTE — Progress Notes (Signed)
Pre visit review using our clinic review tool, if applicable. No additional management support is needed unless otherwise documented below in the visit note. 

## 2013-08-02 NOTE — Progress Notes (Signed)
   Subjective:    Patient ID: Nancy Stevens, female    DOB: 09/18/1985, 27 y.o.   MRN: [id number]  HPI Patient seen for followup regarding attention deficit disorder and hypothyroidism. She's been compliant with her levothyroxin. Denies any symptoms of hypothyroidism. She's battled with some weight issues for several years but no significant weight changes recently. She remains on Adderall for attention deficit disorder. This is been well controlled. No side effects. Requesting refills. She declines flu vaccine.  Past Medical History  Diagnosis Date  . Depression   . Kidney stone   . Migraine   . Thyroid disease   . ADD (attention deficit disorder)    No past surgical history on file.  reports that she has been smoking.  She does not have any smokeless tobacco history on file. Her alcohol and drug histories are not on file. family history includes Cancer in her other; Diabetes in her paternal grandfather; Heart disease in her other and paternal grandfather; Hypertension in her other; Stroke in her other. No Known Allergies    Review of Systems  Constitutional: Negative for fatigue.  Eyes: Negative for visual disturbance.  Respiratory: Negative for cough, chest tightness, shortness of breath and wheezing.   Cardiovascular: Negative for chest pain, palpitations and leg swelling.  Neurological: Negative for dizziness, seizures, syncope, weakness, light-headedness and headaches.       Objective:   Physical Exam  Constitutional: She appears well-developed and well-nourished.  Neck: Neck supple. No thyromegaly present.  Cardiovascular: Normal rate and regular rhythm.   No murmur heard. Pulmonary/Chest: Effort normal and breath sounds normal. No respiratory distress. She has no wheezes. She has no rales.  Musculoskeletal: She exhibits no edema.          Assessment & Plan:  #1 hypothyroidism. Recheck TSH. Continue levothyroxine #2 attention deficit disorder. Refill  Adderall for 3 months.

## 2013-08-22 ENCOUNTER — Telehealth: Payer: Self-pay | Admitting: Family Medicine

## 2013-08-22 MED ORDER — AMPHETAMINE-DEXTROAMPHETAMINE 20 MG PO TABS: 20.0000 mg | ORAL_TABLET | Freq: Two times a day (BID) | ORAL | Status: DC

## 2013-08-22 NOTE — Telephone Encounter (Signed)
Pt aware that RX is ready for pick up  

## 2013-08-22 NOTE — Telephone Encounter (Signed)
Yes.  We can switch to Adderall 20 mg po bid #60

## 2013-08-22 NOTE — Telephone Encounter (Signed)
Pt has lost her health insurance and would like to know if dr burchette will exchange her last 2 scripts for her present amphetamine-dextroamphetamine (ADDERALL XR) 30 MG 24 hr capsule To the non xr time released due to the reg adderall being less expensive. Pharm advised pt she would probably need to take the reg adderall 2 x /day pls advise

## 2013-08-22 NOTE — Telephone Encounter (Signed)
Last visit 08/02/13 Last refill 08/02/13 #30 2 refills

## 2013-10-30 ENCOUNTER — Telehealth: Payer: Self-pay | Admitting: Family Medicine

## 2013-10-30 ENCOUNTER — Other Ambulatory Visit: Payer: Self-pay

## 2013-10-30 MED ORDER — AMPHETAMINE-DEXTROAMPHET ER 30 MG PO CP24: 30.0000 mg | ORAL_CAPSULE | Freq: Two times a day (BID) | ORAL | Status: DC

## 2013-10-30 MED ORDER — AMPHETAMINE-DEXTROAMPHETAMINE 30 MG PO TABS: 30.0000 mg | ORAL_TABLET | Freq: Two times a day (BID) | ORAL | Status: DC

## 2013-10-30 NOTE — Telephone Encounter (Signed)
OK to change to Bid

## 2013-10-30 NOTE — Telephone Encounter (Signed)
Pt needs new rx generic adderall 30 mg twice a day #60. Pt stated generic adderall 20 mg twice a day is not strong enough

## 2013-10-30 NOTE — Telephone Encounter (Signed)
Pt only take the 30 mg once a day pt is wanting to change to twice a day.  Last visit 08/02/13 Last refill 08/02/13 #30 2 refill

## 2013-10-30 NOTE — Telephone Encounter (Signed)
Pt aware that RX is ready for pick up. Changed the RX to BID

## 2013-12-05 ENCOUNTER — Telehealth: Payer: Self-pay | Admitting: Family Medicine

## 2013-12-05 NOTE — Telephone Encounter (Signed)
Last visit 08/02/13 Last refill 10/30/13 #60 0 refill

## 2013-12-05 NOTE — Telephone Encounter (Signed)
Refill OK

## 2013-12-05 NOTE — Telephone Encounter (Signed)
Pt request 3 mo amphetamine-dextroamphetamine (ADDERALL) 30 MG tablet 1/bid

## 2013-12-06 MED ORDER — AMPHETAMINE-DEXTROAMPHETAMINE 30 MG PO TABS: 30.0000 mg | ORAL_TABLET | Freq: Two times a day (BID) | ORAL | Status: DC

## 2013-12-06 NOTE — Telephone Encounter (Signed)
Left message on VM that RX is ready for pick up 

## 2013-12-09 ENCOUNTER — Other Ambulatory Visit: Payer: Self-pay

## 2013-12-09 MED ORDER — AMPHETAMINE-DEXTROAMPHETAMINE 30 MG PO TABS: 30.0000 mg | ORAL_TABLET | Freq: Two times a day (BID) | ORAL | Status: DC

## 2014-03-27 ENCOUNTER — Other Ambulatory Visit: Payer: Self-pay | Admitting: Family Medicine

## 2014-06-24 ENCOUNTER — Telehealth: Payer: Self-pay | Admitting: Family Medicine

## 2014-06-24 NOTE — Telephone Encounter (Signed)
Last visit 08/02/13 Last refill 12/09/13 #60 2 refill

## 2014-06-24 NOTE — Telephone Encounter (Signed)
Pt requesting 90 day refill of amphetamine-dextroamphetamine (ADDERALL) 30 MG tablet.

## 2014-06-25 MED ORDER — AMPHETAMINE-DEXTROAMPHETAMINE 30 MG PO TABS: 30.0000 mg | ORAL_TABLET | Freq: Two times a day (BID) | ORAL | Status: DC

## 2014-06-25 NOTE — Telephone Encounter (Signed)
Refill once.  Needs office follow up. 

## 2014-06-25 NOTE — Telephone Encounter (Signed)
Left detailed message on Vm. That Rx is ready for pick up and needs follow up visit.

## 2014-06-26 ENCOUNTER — Other Ambulatory Visit: Payer: Self-pay | Admitting: Family Medicine

## 2014-07-24 ENCOUNTER — Encounter: Payer: Self-pay | Admitting: Family Medicine

## 2014-07-24 ENCOUNTER — Ambulatory Visit (INDEPENDENT_AMBULATORY_CARE_PROVIDER_SITE_OTHER): Payer: Self-pay | Admitting: Family Medicine

## 2014-07-24 VITALS — BP 124/86 | HR 74 | Temp 98.0°F | Resp 20 | Ht 65.5 in | Wt 246.0 lb

## 2014-07-24 DIAGNOSIS — F988 Other specified behavioral and emotional disorders with onset usually occurring in childhood and adolescence: Secondary | ICD-10-CM

## 2014-07-24 DIAGNOSIS — F909 Attention-deficit hyperactivity disorder, unspecified type: Secondary | ICD-10-CM

## 2014-07-24 DIAGNOSIS — E038 Other specified hypothyroidism: Secondary | ICD-10-CM

## 2014-07-24 MED ORDER — LEVOTHYROXINE SODIUM 112 MCG PO TABS: 112.0000 ug | ORAL_TABLET | Freq: Every day | ORAL | Status: DC

## 2014-07-24 MED ORDER — AMPHETAMINE-DEXTROAMPHETAMINE 30 MG PO TABS: 30.0000 mg | ORAL_TABLET | Freq: Two times a day (BID) | ORAL | Status: DC

## 2014-07-24 NOTE — Progress Notes (Signed)
Pre visit review using our clinic review tool, if applicable. No additional management support is needed unless otherwise documented below in the visit note. 

## 2014-07-24 NOTE — Progress Notes (Signed)
   Subjective:    Patient ID: Nancy Stevens, female    DOB: [birth year], 28 y.o.   MRN: [id number]  HPI  Patient seen for medical follow-up. She has history of attention deficit disorder and hypothyroidism. She is compliant with medication. Has not had TSH in one year. Overall feels stable. No recent appetite or weight changes. No headaches. She needs refills of Adderall. She is currently without insurance. She realizes that she needs complete physical but wishes to defer until she has insurance coverage. Denies any medication side effects.  Past Medical History  Diagnosis Date  . Depression   . Kidney stone   . Migraine   . Thyroid disease   . ADD (attention deficit disorder)    No past surgical history on file.  reports that she has been smoking.  She does not have any smokeless tobacco history on file. Her alcohol and drug histories are not on file. family history includes Cancer in her other; Diabetes in her paternal grandfather; Heart disease in her other and paternal grandfather; Hypertension in her other; Stroke in her other. No Known Allergies   Review of Systems  Constitutional: Negative for fatigue.  Eyes: Negative for visual disturbance.  Respiratory: Negative for cough, chest tightness, shortness of breath and wheezing.   Cardiovascular: Negative for chest pain, palpitations and leg swelling.  Neurological: Negative for dizziness, seizures, syncope, weakness, light-headedness and headaches.       Objective:   Physical Exam  Constitutional: She is oriented to person, place, and time. She appears well-developed and well-nourished.  Neck: Neck supple. No thyromegaly present.  Cardiovascular: Normal rate and regular rhythm.  Exam reveals no gallop.   No murmur heard. Pulmonary/Chest: Effort normal and breath sounds normal. No respiratory distress. She has no wheezes. She has no rales.  Neurological: She is alert and oriented to person, place, and time.            Assessment & Plan:  #1 hypothyroidism. Recheck TSH. Refill Levothroid for one year #2 attention deficit disorder. Stable. Refill Adderall for 3 months.

## 2014-07-25 ENCOUNTER — Telehealth: Payer: Self-pay | Admitting: Family Medicine

## 2014-07-25 LAB — TSH: TSH: 3.01 u[IU]/mL (ref 0.35–4.50)

## 2014-07-25 NOTE — Telephone Encounter (Signed)
emmi emailed °

## 2014-07-31 ENCOUNTER — Other Ambulatory Visit: Payer: Self-pay | Admitting: Family Medicine

## 2014-12-11 ENCOUNTER — Telehealth: Payer: Self-pay | Admitting: Family Medicine

## 2014-12-11 MED ORDER — LEVOTHYROXINE SODIUM 112 MCG PO TABS: 112.0000 ug | ORAL_TABLET | Freq: Every day | ORAL | Status: DC

## 2014-12-11 NOTE — Telephone Encounter (Signed)
Refills OK. 

## 2014-12-11 NOTE — Telephone Encounter (Signed)
Last visit 07/24/14 Last refill 07/24/14 #60 2 refills

## 2014-12-11 NOTE — Telephone Encounter (Signed)
Pt needs new 3 rx generic adderall 30 mg for next 3 months. Pt needs new rx send to new pharm  [facility] levothyroxine 112 mcg #90 w/refills

## 2014-12-12 MED ORDER — AMPHETAMINE-DEXTROAMPHETAMINE 30 MG PO TABS: 30.0000 mg | ORAL_TABLET | Freq: Two times a day (BID) | ORAL | Status: DC

## 2014-12-12 NOTE — Telephone Encounter (Signed)
Left message on Vm that Rx is ready for pickup  

## 2015-05-05 ENCOUNTER — Encounter: Payer: Self-pay | Admitting: Family Medicine

## 2015-05-05 ENCOUNTER — Ambulatory Visit (INDEPENDENT_AMBULATORY_CARE_PROVIDER_SITE_OTHER): Payer: Self-pay | Admitting: Family Medicine

## 2015-05-05 VITALS — BP 120/80 | HR 91 | Temp 98.1°F | Ht 65.5 in | Wt 226.6 lb

## 2015-05-05 DIAGNOSIS — L509 Urticaria, unspecified: Secondary | ICD-10-CM

## 2015-05-05 DIAGNOSIS — E038 Other specified hypothyroidism: Secondary | ICD-10-CM

## 2015-05-05 LAB — TSH: TSH: 2.26 u[IU]/mL (ref 0.35–4.50)

## 2015-05-05 NOTE — Progress Notes (Signed)
Pre visit review using our clinic review tool, if applicable. No additional management support is needed unless otherwise documented below in the visit note. 

## 2015-05-05 NOTE — Patient Instructions (Signed)
Hives Hives are itchy, red, swollen areas of the skin. They can vary in size and location on your body. Hives can come and go for hours or several days (acute hives) or for several weeks (chronic hives). Hives do not spread from person to person (noncontagious). They may get worse with scratching, exercise, and emotional stress. CAUSES   Allergic reaction to food, additives, or drugs.  Infections, including the common cold.  Illness, such as vasculitis, lupus, or thyroid disease.  Exposure to sunlight, heat, or cold.  Exercise.  Stress.  Contact with chemicals. SYMPTOMS   Red or white swollen patches on the skin. The patches may change size, shape, and location quickly and repeatedly.  Itching.  Swelling of the hands, feet, and face. This may occur if hives develop deeper in the skin. DIAGNOSIS  Your caregiver can usually tell what is wrong by performing a physical exam. Skin or blood tests may also be done to determine the cause of your hives. In some cases, the cause cannot be determined. TREATMENT  Mild cases usually get better with medicines such as antihistamines. Severe cases may require an emergency epinephrine injection. If the cause of your hives is known, treatment includes avoiding that trigger.  HOME CARE INSTRUCTIONS   Avoid causes that trigger your hives.  Take antihistamines as directed by your caregiver to reduce the severity of your hives. Non-sedating or low-sedating antihistamines are usually recommended. Do not drive while taking an antihistamine.  Take any other medicines prescribed for itching as directed by your caregiver.  Wear loose-fitting clothing.  Keep all follow-up appointments as directed by your caregiver. SEEK MEDICAL CARE IF:   You have persistent or severe itching that is not relieved with medicine.  You have painful or swollen joints. SEEK IMMEDIATE MEDICAL CARE IF:   You have a fever.  Your tongue or lips are swollen.  You have  trouble breathing or swallowing.  You feel tightness in the throat or chest.  You have abdominal pain. These problems may be the first sign of a life-threatening allergic reaction. Call your local emergency services (911 in [place] MAKE SURE YOU:   Understand these instructions.  Will watch your condition.  Will get help right away if you are not doing well or get worse. Document Released: 08/01/2005 Document Revised: 08/06/2013 Document Reviewed: 10/25/2011 [facility] Patient Information 2015 [place], [place]. This information is not intended to replace advice given to you by your health care provider. Make sure you discuss any questions you have with your health care provider.  Consider over the counter Claritan, Zyrtec, or Allegra Could also add H2blocker such as Pepcid or Zantac to counter hives.

## 2015-05-05 NOTE — Progress Notes (Signed)
   Subjective:    Patient ID: Nancy Stevens, female    DOB: Sep 15, 1985, 29 y.o.   MRN: [id number]  HPI  Patient here with hives. She states which was first diagnosed with hypothyroidism she had similar hives and wonders if her thyroid is out of range. She takes levothyroxin 112 g daily consistently. She does have pets-dogs and cats but does not think this is a trigger. Her hives tend to occur more often at night. No recent change of soaps or detergent. No clear triggers. She's never had any angioedema type symptoms. She is not taking any anti-histamines or H2 blockers. No allergy testing. Not aware of any food allergies  Past Medical History  Diagnosis Date  . Depression   . Kidney stone   . Migraine   . Thyroid disease   . ADD (attention deficit disorder)    No past surgical history on file.  reports that she has been smoking.  She does not have any smokeless tobacco history on file. Her alcohol and drug histories are not on file. family history includes Cancer in her other; Diabetes in her paternal grandfather; Heart disease in her other and paternal grandfather; Hypertension in her other; Stroke in her other. No Known Allergies   Review of Systems  Constitutional: Negative for fever and chills.  Respiratory: Negative for cough and shortness of breath.   Cardiovascular: Negative for chest pain.  Hematological: Negative for adenopathy.       Objective:   Physical Exam  Constitutional: She appears well-developed and well-nourished.  Neck: Neck supple. No thyromegaly present.  Cardiovascular: Normal rate and regular rhythm.   Pulmonary/Chest: Effort normal and breath sounds normal. No respiratory distress. She has no wheezes. She has no rales.  Skin: No rash noted.          Assessment & Plan:  #1 hypothyroidism. Recheck TSH. #2 intermittent hives. No clear trigger. Pay attention to foods and other possible triggers. Handout given. Recommend daily anti-histamines such as  Claritin, Allegra, or Zyrtec. Consider addition of H2 blocker such as Zantac or Pepcid.

## 2015-05-06 NOTE — Progress Notes (Signed)
Gave the result to the pt

## 2015-06-09 ENCOUNTER — Telehealth: Payer: Self-pay | Admitting: Family Medicine

## 2015-06-09 NOTE — Telephone Encounter (Signed)
Patient would like her ADDERALL refilled. °

## 2015-06-10 MED ORDER — AMPHETAMINE-DEXTROAMPHETAMINE 30 MG PO TABS: 30.0000 mg | ORAL_TABLET | Freq: Two times a day (BID) | ORAL | Status: DC

## 2015-06-10 NOTE — Telephone Encounter (Signed)
Refills OK for 3 months. 

## 2015-06-10 NOTE — Telephone Encounter (Signed)
Rx ready for pick up. Pt is aware  

## 2015-06-10 NOTE — Telephone Encounter (Signed)
Pt last visit 05/05/15 and last Rx refill 12/12/14 #60 with 2 refills

## 2015-06-15 ENCOUNTER — Other Ambulatory Visit (INDEPENDENT_AMBULATORY_CARE_PROVIDER_SITE_OTHER): Payer: Self-pay

## 2015-06-15 ENCOUNTER — Ambulatory Visit (INDEPENDENT_AMBULATORY_CARE_PROVIDER_SITE_OTHER)
Admission: RE | Admit: 2015-06-15 | Discharge: 2015-06-15 | Disposition: A | Discharge status: Home / Self Care | Payer: Self-pay | Source: Ambulatory Visit | Attending: Internal Medicine | Admitting: Internal Medicine

## 2015-06-15 ENCOUNTER — Encounter: Payer: Self-pay | Admitting: Internal Medicine

## 2015-06-15 ENCOUNTER — Ambulatory Visit (INDEPENDENT_AMBULATORY_CARE_PROVIDER_SITE_OTHER): Payer: Self-pay | Admitting: Internal Medicine

## 2015-06-15 VITALS — BP 124/82 | HR 93 | Temp 98.1°F | Resp 18 | Wt 227.0 lb

## 2015-06-15 DIAGNOSIS — M2540 Effusion, unspecified joint: Secondary | ICD-10-CM

## 2015-06-15 DIAGNOSIS — M2550 Pain in unspecified joint: Secondary | ICD-10-CM

## 2015-06-15 DIAGNOSIS — R21 Rash and other nonspecific skin eruption: Secondary | ICD-10-CM

## 2015-06-15 DIAGNOSIS — M25439 Effusion, unspecified wrist: Secondary | ICD-10-CM

## 2015-06-15 LAB — C-REACTIVE PROTEIN: CRP: 12.9 mg/dL (ref 0.5–20.0)

## 2015-06-15 LAB — COMPREHENSIVE METABOLIC PANEL
ALBUMIN: 3.7 g/dL (ref 3.5–5.2)
ALK PHOS: 89 U/L (ref 39–117)
ALT: 16 U/L (ref 0–35)
AST: 24 U/L (ref 0–37)
BUN: 13 mg/dL (ref 6–23)
CALCIUM: 9.2 mg/dL (ref 8.4–10.5)
CHLORIDE: 102 meq/L (ref 96–112)
CO2: 29 mEq/L (ref 19–32)
Creatinine, Ser: 0.71 mg/dL (ref 0.40–1.20)
GFR: 103.2 mL/min (ref >60.00)
Glucose, Bld: 97 mg/dL (ref 70–99)
POTASSIUM: 4.2 meq/L (ref 3.5–5.1)
Sodium: 139 mEq/L (ref 135–145)
TOTAL PROTEIN: 7.3 g/dL (ref 6.0–8.3)
Total Bilirubin: 0.3 mg/dL (ref 0.2–1.2)

## 2015-06-15 LAB — CBC WITH DIFFERENTIAL/PLATELET
BASOS ABS: 0 10*3/uL (ref 0.0–0.1)
BASOS PCT: 0.2 % (ref 0.0–3.0)
EOS ABS: 0.1 10*3/uL (ref 0.0–0.7)
Eosinophils Relative: 0.8 % (ref 0.0–5.0)
HEMATOCRIT: 42.8 % (ref 36.0–46.0)
Hemoglobin: 14.4 g/dL (ref 12.0–15.0)
LYMPHS ABS: 2.4 10*3/uL (ref 0.7–4.0)
LYMPHS PCT: 18.9 % (ref 12.0–46.0)
MCHC: 33.6 g/dL (ref 30.0–36.0)
MCV: 89.6 fl (ref 78.0–100.0)
MONOS PCT: 5.4 % (ref 3.0–12.0)
Monocytes Absolute: 0.7 10*3/uL (ref 0.1–1.0)
NEUTROS ABS: 9.3 10*3/uL — AB (ref 1.4–7.7)
NEUTROS PCT: 74.7 % (ref 43.0–77.0)
Platelets: 200 10*3/uL (ref 150.0–400.0)
RBC: 4.78 Mil/uL (ref 3.87–5.11)
RDW: 13.1 % (ref 11.5–15.5)
WBC: 12.5 10*3/uL — ABNORMAL HIGH (ref 4.0–10.5)

## 2015-06-15 LAB — SEDIMENTATION RATE: SED RATE: 45 mm/h — AB (ref 0–22)

## 2015-06-15 NOTE — Progress Notes (Signed)
Subjective:    Patient ID: Nancy Stevens, female    DOB: 22-Apr-1986, 29 y.o.   MRN: [id number]  HPI She is here today for an acute visit for swollen, painful ankles and a rash.  She was finishing a deck two days ago.  She was using stain and paint thinner.  She did get both on her hands and legs.  Throughout the project she was jumping off the edge of the deck multiple times.  Midnight Saturday night she was at a party and started to have right foot pain.  Both feet looked swollen.  She put ice on the right foot and took an otc pain medication - advil or aleve.  She denies any specific injury to her foot.    Both feet and ankles are swollen.  The left foot/ankle is tender but just from the swelling. The right foot is slightly painful from the swelling, but also hurts more in the foot and first toe with walking.  She has pain at rest, but the pain is worse with pressure/walking.    She has been applying ice and taking advil or aleve intermittently - nothing regular.  Yesterday she noticed a rash.  The rash initially was only on her legs and now has spread throughout the whole body.  It does not itch or cause pain.  She thinks the rash is getting better today.  She has a history of hives, but this is nothing like that.    She also states knee pain and wrist pain - she thinks this might be from compensating for the foot pain and getting up and down so many times when working on the deck. She has had some more headaches that usual in the past week or so.  She denies fatigue out of the ordinary.    Her only travel recently was to [place].  She has had mosquito bites, but denies any other bug bites.   Medications and allergies reviewed with patient and updated if appropriate.  Patient Active Problem List   Diagnosis Date Noted  . Hypothyroidism 03/11/2010  . DEPRESSION 03/11/2010  . Attention deficit disorder 03/11/2010  . RENAL CALCULUS, HX OF 03/11/2010  . MIGRAINES, HX OF 03/11/2010     Past Medical History  Diagnosis Date  . Depression   . Kidney stone   . Migraine   . Thyroid disease   . ADD (attention deficit disorder)     No past surgical history on file.  Social History   Social History  . Marital Status: Married    Spouse Name: N/A  . Number of Children: N/A  . Years of Education: N/A   Social History Main Topics  . Smoking status: Current Every Day Smoker  . Smokeless tobacco: None  . Alcohol Use: None  . Drug Use: None  . Sexual Activity: Not Asked   Other Topics Concern  . None   Social History Narrative    Review of Systems  Constitutional: Negative for fever, chills and fatigue.  HENT: Negative for congestion, postnasal drip, sinus pressure and sore throat.   Respiratory: Negative for cough, shortness of breath and wheezing.   Cardiovascular: Positive for leg swelling. Negative for chest pain and palpitations.  Gastrointestinal: Negative for nausea, abdominal pain, diarrhea and constipation.  Musculoskeletal: Positive for joint swelling. Negative for myalgias.  Neurological: Positive for headaches. Negative for light-headedness.       Objective:   Filed Vitals:   06/15/15 1123  BP: 124/82  Pulse: 93  Temp: 98.1 F (36.7 C)  Resp: 18   Filed Weights   06/15/15 1123  Weight: 227 lb (102.967 kg)   Body mass index is 37.19 kg/(m^2).   Physical Exam  Constitutional: She appears well-developed and well-nourished. No distress.  HENT:  Head: Normocephalic and atraumatic.  Right Ear: External ear normal.  Left Ear: External ear normal.  Mouth/Throat: Oropharynx is clear and moist. No oropharyngeal exudate.  Eyes: Conjunctivae and EOM are normal.  Neck: Neck supple. No tracheal deviation present. No thyromegaly present.  Cardiovascular: Normal rate, regular rhythm and normal heart sounds.   No murmur heard. Pulmonary/Chest: Effort normal and breath sounds normal. No respiratory distress. She has no wheezes.   Musculoskeletal: She exhibits edema (b/l lower extremities - ankles and feet, non-pitting).  joint swelling in b/l wrists, knee and ankles, toes.  Right first toe painful to palpation, no pain with palpation of foot or ankle b/l  Lymphadenopathy:    She has no cervical adenopathy.  Skin: Rash (macular papular rash throughout legs, arms) noted. She is not diaphoretic.        Assessment & Plan:   Diffuse rash, right first toe pain, b/l ankle and foot swelling, wrist and knee pain and swelling b/l and headaches  Possibly viral in nature, will rule out autoimmune process and lyme disease Will check xray of right foot/toe since the toe is very tender to palpation - possible stress fracture vs pain from swelling Advil, aleve or excedrin with food for pain/swelling Ice, elevate Will consider quick steroid taper for joint pain/swelling depending on xray and blood work   We will call her with the results and she will call me if her symptoms worsen or do not improve.

## 2015-06-15 NOTE — Patient Instructions (Signed)
  Test(s) ordered today. Your results will be released to MyChart (or called to you) after review, usually within 72hours after test completion. If any changes need to be made, you will be notified at that same time.   Medications reviewed.  Start advil, aleve or excedrin as directed.  Take with food and stop if develop any stomach pain or upset.  Elevated your ankles/feet and use ice as needed.   Call if your symptoms worsen or do not improve.

## 2015-06-15 NOTE — Progress Notes (Signed)
Pre visit review using our clinic review tool, if applicable. No additional management support is needed unless otherwise documented below in the visit note. 

## 2015-06-16 ENCOUNTER — Telehealth: Payer: Self-pay | Admitting: Emergency Medicine

## 2015-06-16 DIAGNOSIS — M2550 Pain in unspecified joint: Secondary | ICD-10-CM

## 2015-06-16 DIAGNOSIS — R768 Other specified abnormal immunological findings in serum: Secondary | ICD-10-CM

## 2015-06-16 DIAGNOSIS — M2540 Effusion, unspecified joint: Secondary | ICD-10-CM

## 2015-06-16 DIAGNOSIS — R21 Rash and other nonspecific skin eruption: Secondary | ICD-10-CM

## 2015-06-16 LAB — ANTI-NUCLEAR AB-TITER (ANA TITER)

## 2015-06-16 LAB — ANA: Anti Nuclear Antibody(ANA): POSITIVE — AB

## 2015-06-16 NOTE — Telephone Encounter (Signed)
-----   Message from Pincus SanesStacy J Burns, MD sent at 06/15/2015  1:09 PM EDT ----- No bone abnormalities / fractures in foot.

## 2015-06-16 NOTE — Telephone Encounter (Signed)
Her test for lyme disease is pending.  Her basic blood work is normal.  One of her tests for autoimmune arthritis is positive - this may or may not mean she has an autoimmune arthritis.  She needs to see a rheumatologist - I have put in a referral.  See how her joint pain and swelling is.

## 2015-06-16 NOTE — Telephone Encounter (Signed)
Spoke with pt. Pt stated that she feels the swelling is moving into her hands now. I informed her that the lab results were not released yet and would give her a call back when they were.

## 2015-06-16 NOTE — Telephone Encounter (Signed)
Patient called again wanting her lab results.

## 2015-06-17 LAB — LYME, IGM, EARLY TEST/REFLEX

## 2015-06-17 MED ORDER — PREDNISONE 10 MG PO TABS: ORAL_TABLET | ORAL | Status: DC

## 2015-06-17 NOTE — Telephone Encounter (Signed)
Spoke with pt. She stated that the swelling seems to be a little better in her feet but is about the same if not worse in her hands and fingers. Pt would like to know if there is something that can be sent in for the swelling. Informed her of MD's note.

## 2015-06-17 NOTE — Telephone Encounter (Signed)
Spoke with pt to inform.  

## 2015-06-17 NOTE — Telephone Encounter (Signed)
Lets try a prednisone taper -- she should take the prednisone with food early in the morning.  She should not take any nsaids (advil, aspirin/excedrin, aleve or ibuprofen) while taking the prednisone.  She can take tylenol.  I would like to see her in the office when she finishes the medication.

## 2015-06-22 ENCOUNTER — Telehealth: Payer: Self-pay | Admitting: Family Medicine

## 2015-06-22 DIAGNOSIS — M2540 Effusion, unspecified joint: Secondary | ICD-10-CM

## 2015-06-22 DIAGNOSIS — M2550 Pain in unspecified joint: Secondary | ICD-10-CM

## 2015-06-22 NOTE — Telephone Encounter (Signed)
Please review note and labs from 06/15/2015. Okay for referral? Which DX code would I need to link order too?

## 2015-06-22 NOTE — Telephone Encounter (Signed)
OK to refer.  She had very nonspecific ANA elevation.  Would use "joint pain" ICD 10 M25.50 and "joint swelling" ICD 10 25.40     Would either set up with Dr Dierdre ForthBeekman, et al or Dr Kellie Simmeringruslow.  Will need to fax over her labs and note from Dr Lawerance BachBurns.  Thx!

## 2015-06-22 NOTE — Telephone Encounter (Signed)
Pt saw dr burns last week and dr burns would like to refer her to rheumatologist and pt would like to see if dr burchette is in agreement. Pt has abnormal blood work

## 2015-06-23 NOTE — Telephone Encounter (Signed)
Referral placed.

## 2015-09-17 ENCOUNTER — Other Ambulatory Visit: Payer: Self-pay | Admitting: Family Medicine

## 2015-12-18 ENCOUNTER — Telehealth: Payer: Self-pay | Admitting: Family Medicine

## 2015-12-18 MED ORDER — AMPHETAMINE-DEXTROAMPHETAMINE 30 MG PO TABS: 30.0000 mg | ORAL_TABLET | Freq: Two times a day (BID) | ORAL | Status: DC

## 2015-12-18 NOTE — Telephone Encounter (Signed)
Printed

## 2015-12-18 NOTE — Telephone Encounter (Signed)
Last refill was 06/10/2015 x3 Last OV 05/05/2015 No pending scheduled Please advice on refill

## 2015-12-18 NOTE — Telephone Encounter (Signed)
Pt is aware via voicemail that RX is ready for pick up and to schedule a follow up.

## 2015-12-18 NOTE — Telephone Encounter (Signed)
Refill OK

## 2015-12-18 NOTE — Telephone Encounter (Signed)
Pt request refill  amphetamine-dextroamphetamine (ADDERALL) 30 MG tablet  BID 3 mo supply

## 2015-12-22 ENCOUNTER — Other Ambulatory Visit: Payer: Self-pay | Admitting: Internal Medicine

## 2016-01-19 ENCOUNTER — Encounter: Payer: Self-pay | Admitting: Family Medicine

## 2016-01-19 ENCOUNTER — Ambulatory Visit (INDEPENDENT_AMBULATORY_CARE_PROVIDER_SITE_OTHER): Payer: Self-pay | Admitting: Family Medicine

## 2016-01-19 VITALS — BP 134/94 | HR 97 | Temp 98.3°F | Ht 65.5 in | Wt 233.6 lb

## 2016-01-19 DIAGNOSIS — E038 Other specified hypothyroidism: Secondary | ICD-10-CM

## 2016-01-19 DIAGNOSIS — F909 Attention-deficit hyperactivity disorder, unspecified type: Secondary | ICD-10-CM

## 2016-01-19 DIAGNOSIS — F988 Other specified behavioral and emotional disorders with onset usually occurring in childhood and adolescence: Secondary | ICD-10-CM

## 2016-01-19 MED ORDER — AMPHETAMINE-DEXTROAMPHETAMINE 30 MG PO TABS: 30.0000 mg | ORAL_TABLET | Freq: Two times a day (BID) | ORAL | Status: DC

## 2016-01-19 NOTE — Progress Notes (Signed)
   Subjective:    Patient ID: Nancy Stevens, female    DOB: 1986/07/04, 30 y.o.   MRN: [id number]  HPI Medical follow-up  Attention deficit disorder. She takes Adderall 30 mg once or twice daily. No appetite suppression. She's had some gradual weight gain in recent years. Adderall is working well for her and she has not had any headaches or other side effects. Due for refills at this time.  Hypothyroidism. TSH was at goal last fall. Compliant with therapy.  Patient had nonspecific arthralgias several months ago and nonspecific positive ANA. She saw rheumatologist and was felt she had post viral type arthralgias. After course of prednisone her symptoms resolved and she's not had any recurrent symptoms since then.  Past Medical History  Diagnosis Date  . Depression   . Kidney stone   . Migraine   . Thyroid disease   . ADD (attention deficit disorder)    No past surgical history on file.  reports that she has been smoking.  She does not have any smokeless tobacco history on file. Her alcohol and drug histories are not on file. family history includes Cancer in her other; Diabetes in her paternal grandfather; Heart disease in her other and paternal grandfather; Hypertension in her other; Stroke in her other. No Known Allergies    Review of Systems  Constitutional: Negative for fever, chills, fatigue and unexpected weight change.  Eyes: Negative for visual disturbance.  Respiratory: Negative for cough, chest tightness, shortness of breath and wheezing.   Cardiovascular: Negative for chest pain, palpitations and leg swelling.  Endocrine: Negative for polydipsia and polyuria.  Neurological: Negative for dizziness, seizures, syncope, weakness, light-headedness and headaches.       Objective:   Physical Exam  Constitutional: She appears well-developed and well-nourished. No distress.  Neck: Neck supple. No thyromegaly present.  Cardiovascular: Normal rate and regular rhythm.     Pulmonary/Chest: Breath sounds normal. No respiratory distress. She has no wheezes. She has no rales.  Musculoskeletal: She exhibits no edema.          Assessment & Plan:  #1 attention deficit disorder. Refill Adderall for 3 months.  #2 hypothyroidism. Continue levothyroxine. Recheck TSH at follow-up in 6 months.  Kristian CoveyBruce W Amyiah Gaba MD [facility]

## 2016-01-19 NOTE — Progress Notes (Signed)
Pre visit review using our clinic review tool, if applicable. No additional management support is needed unless otherwise documented below in the visit note. 

## 2016-06-28 ENCOUNTER — Other Ambulatory Visit: Payer: Self-pay | Admitting: Family Medicine

## 2016-09-21 ENCOUNTER — Telehealth: Payer: Self-pay | Admitting: Family Medicine

## 2016-09-21 MED ORDER — AMPHETAMINE-DEXTROAMPHETAMINE 30 MG PO TABS: 30.0000 mg | ORAL_TABLET | Freq: Two times a day (BID) | ORAL | 0 refills | Status: DC

## 2016-09-21 NOTE — Telephone Encounter (Signed)
Pt request refill amphetamine-dextroamphetamine (ADDERALL) 30 MG tablet °3 mo supply °

## 2016-09-21 NOTE — Telephone Encounter (Signed)
Last refill 01/19/2016 x3 #60 Last OV 01/19/2016 Please advise

## 2016-09-21 NOTE — Telephone Encounter (Signed)
Refill OK

## 2016-09-21 NOTE — Telephone Encounter (Signed)
Pt is aware via voicemail that scripts are up front.  

## 2016-12-30 ENCOUNTER — Other Ambulatory Visit: Payer: Self-pay | Admitting: Family Medicine

## 2017-01-27 ENCOUNTER — Encounter: Payer: Self-pay | Admitting: Family Medicine

## 2017-01-27 ENCOUNTER — Ambulatory Visit (INDEPENDENT_AMBULATORY_CARE_PROVIDER_SITE_OTHER): Payer: Self-pay | Admitting: Family Medicine

## 2017-01-27 VITALS — BP 112/82 | HR 80 | Temp 97.9°F | Ht 65.5 in | Wt 248.8 lb

## 2017-01-27 DIAGNOSIS — F988 Other specified behavioral and emotional disorders with onset usually occurring in childhood and adolescence: Secondary | ICD-10-CM

## 2017-01-27 DIAGNOSIS — E038 Other specified hypothyroidism: Secondary | ICD-10-CM

## 2017-01-27 LAB — TSH: TSH: 5.23 u[IU]/mL — ABNORMAL HIGH (ref 0.35–4.50)

## 2017-01-27 MED ORDER — AMPHETAMINE-DEXTROAMPHETAMINE 30 MG PO TABS: 30.0000 mg | ORAL_TABLET | Freq: Two times a day (BID) | ORAL | 0 refills | Status: DC

## 2017-01-27 MED ORDER — LEVOTHYROXINE SODIUM 112 MCG PO TABS: ORAL_TABLET | ORAL | 3 refills | Status: DC

## 2017-01-27 NOTE — Progress Notes (Signed)
Subjective:     Patient ID: Nancy Stevens, female   DOB: 25-Dec-1985, 31 y.o.   MRN: [id number]  HPI Here for follow-up regarding hypothyroidism and attention deficit disorder. She's been on levothyroxine for several years and compliant with therapy. She has had some recent weight gain and attributes this to poor compliance over the winter. She states usually she loses about 20-25 pounds every summer. She stays active and usually gets over 10,000 steps per day.  She has history of attention deficit disorders on Adderall 30 mg twice daily. Requesting refills. No history of hypertension. No headaches. No dizziness.  Past Medical History:  Diagnosis Date  . ADD (attention deficit disorder)   . Depression   . Kidney stone   . Migraine   . Thyroid disease    No past surgical history on file.  reports that she has been smoking.  She has quit using smokeless tobacco. Her alcohol and drug histories are not on file. family history includes Cancer in her other; Diabetes in her paternal grandfather; Heart disease in her other and paternal grandfather; Hypertension in her other; Stroke in her other. No Known Allergies     Review of Systems  Constitutional: Negative for fatigue.  Eyes: Negative for visual disturbance.  Respiratory: Negative for cough, chest tightness, shortness of breath and wheezing.   Cardiovascular: Negative for chest pain, palpitations and leg swelling.  Endocrine: Negative for polydipsia and polyuria.  Neurological: Negative for dizziness, seizures, syncope, weakness, light-headedness and headaches.       Objective:   Physical Exam  Constitutional: She appears well-developed and well-nourished.  Eyes: Pupils are equal, round, and reactive to light.  Neck: Neck supple. No thyromegaly present.  Cardiovascular: Normal rate and regular rhythm.  Exam reveals no gallop.   Pulmonary/Chest: Effort normal and breath sounds normal. No respiratory distress. She has no wheezes.  She has no rales.  Musculoskeletal: She exhibits no edema.  Neurological: She is alert.       Assessment:     #1 hypothyroidism on replacement  #2 attention deficit disorder    Plan:     -Recheck TSH -Refill levothyroxin for one year -Refill Adderall for 3 months -Patient's needs tetanus booster. She is currently without insurance coverage. She'll check to see if she can get this more cost effectively at the pharmacy  Kristian CoveyBruce W Burchette MD [facility]

## 2017-01-31 ENCOUNTER — Other Ambulatory Visit: Payer: Self-pay | Admitting: Family Medicine

## 2017-01-31 DIAGNOSIS — E038 Other specified hypothyroidism: Secondary | ICD-10-CM

## 2017-01-31 MED ORDER — LEVOTHYROXINE SODIUM 125 MCG PO TABS: 125.0000 ug | ORAL_TABLET | Freq: Every day | ORAL | 1 refills | Status: DC

## 2017-03-12 IMAGING — DX DG FOOT COMPLETE 3+V*R*
3 series · 3 of 3 positions shown · non-contrast
Comparison: None in PACs

CLINICAL DATA: Right foot pain and swelling centered over the great
toe for the past 2 days without known injury.

EXAM:
RIGHT FOOT COMPLETE - 3+ VIEW

[foot ap]
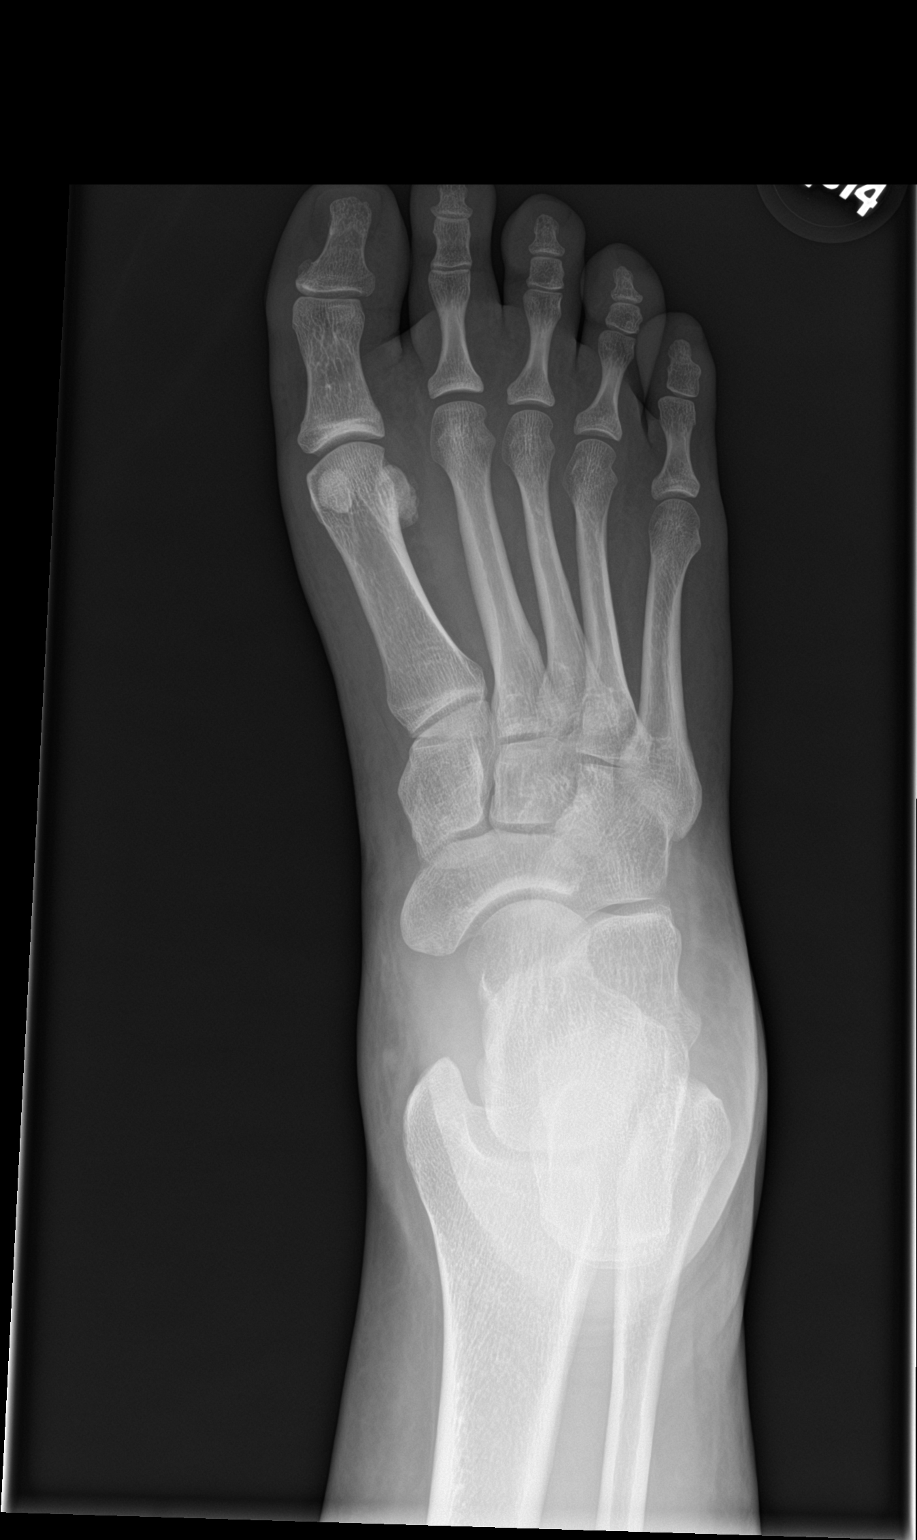

[foot obl]
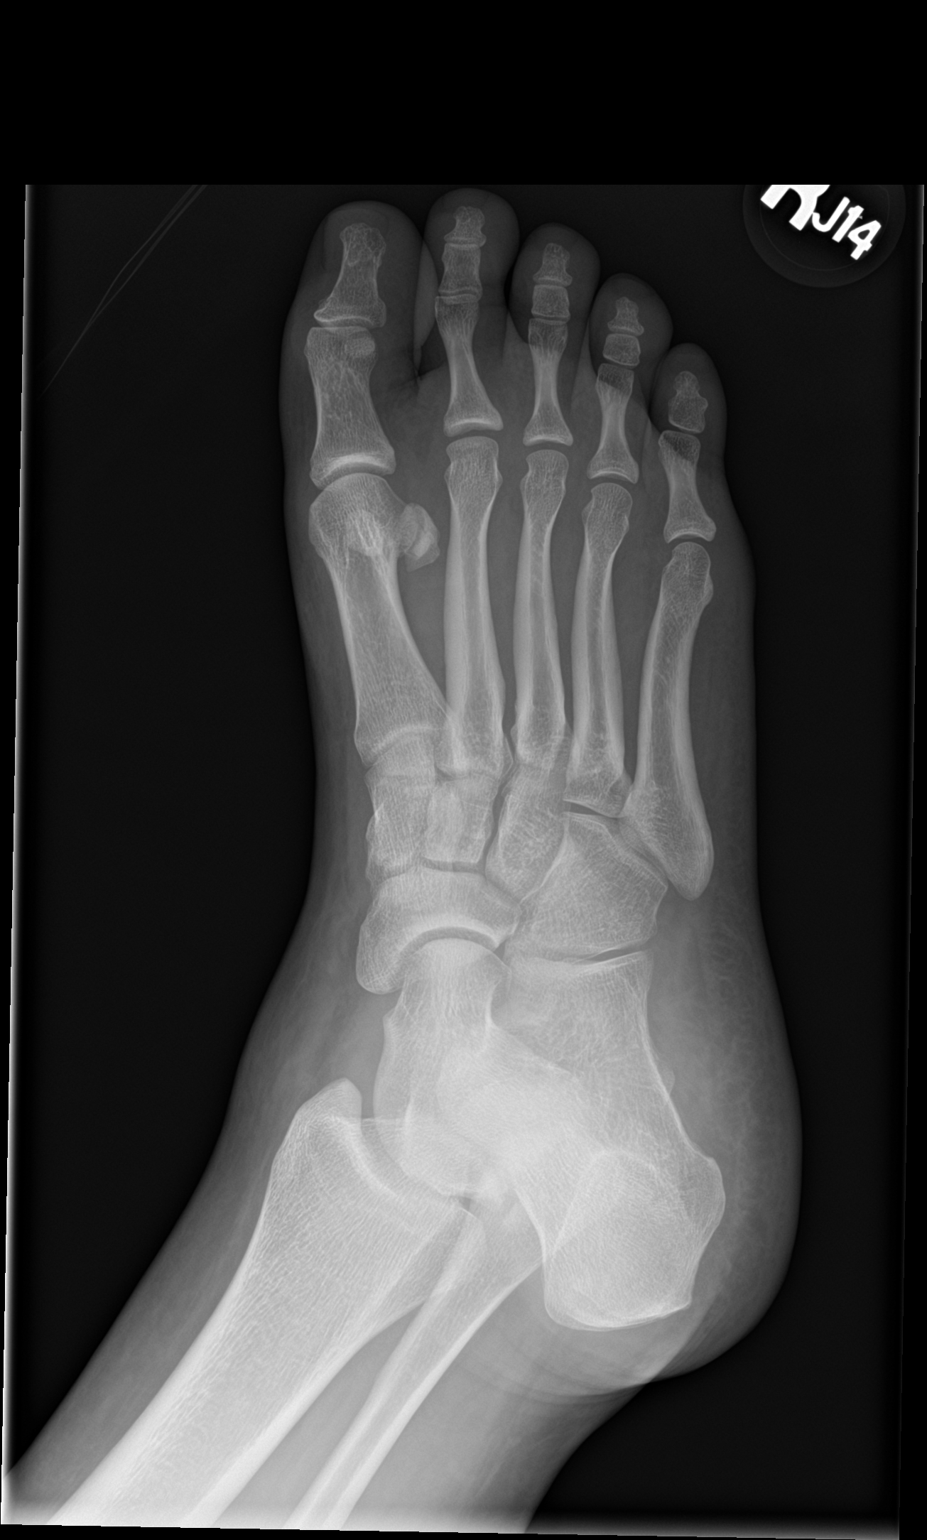

[foot lat]
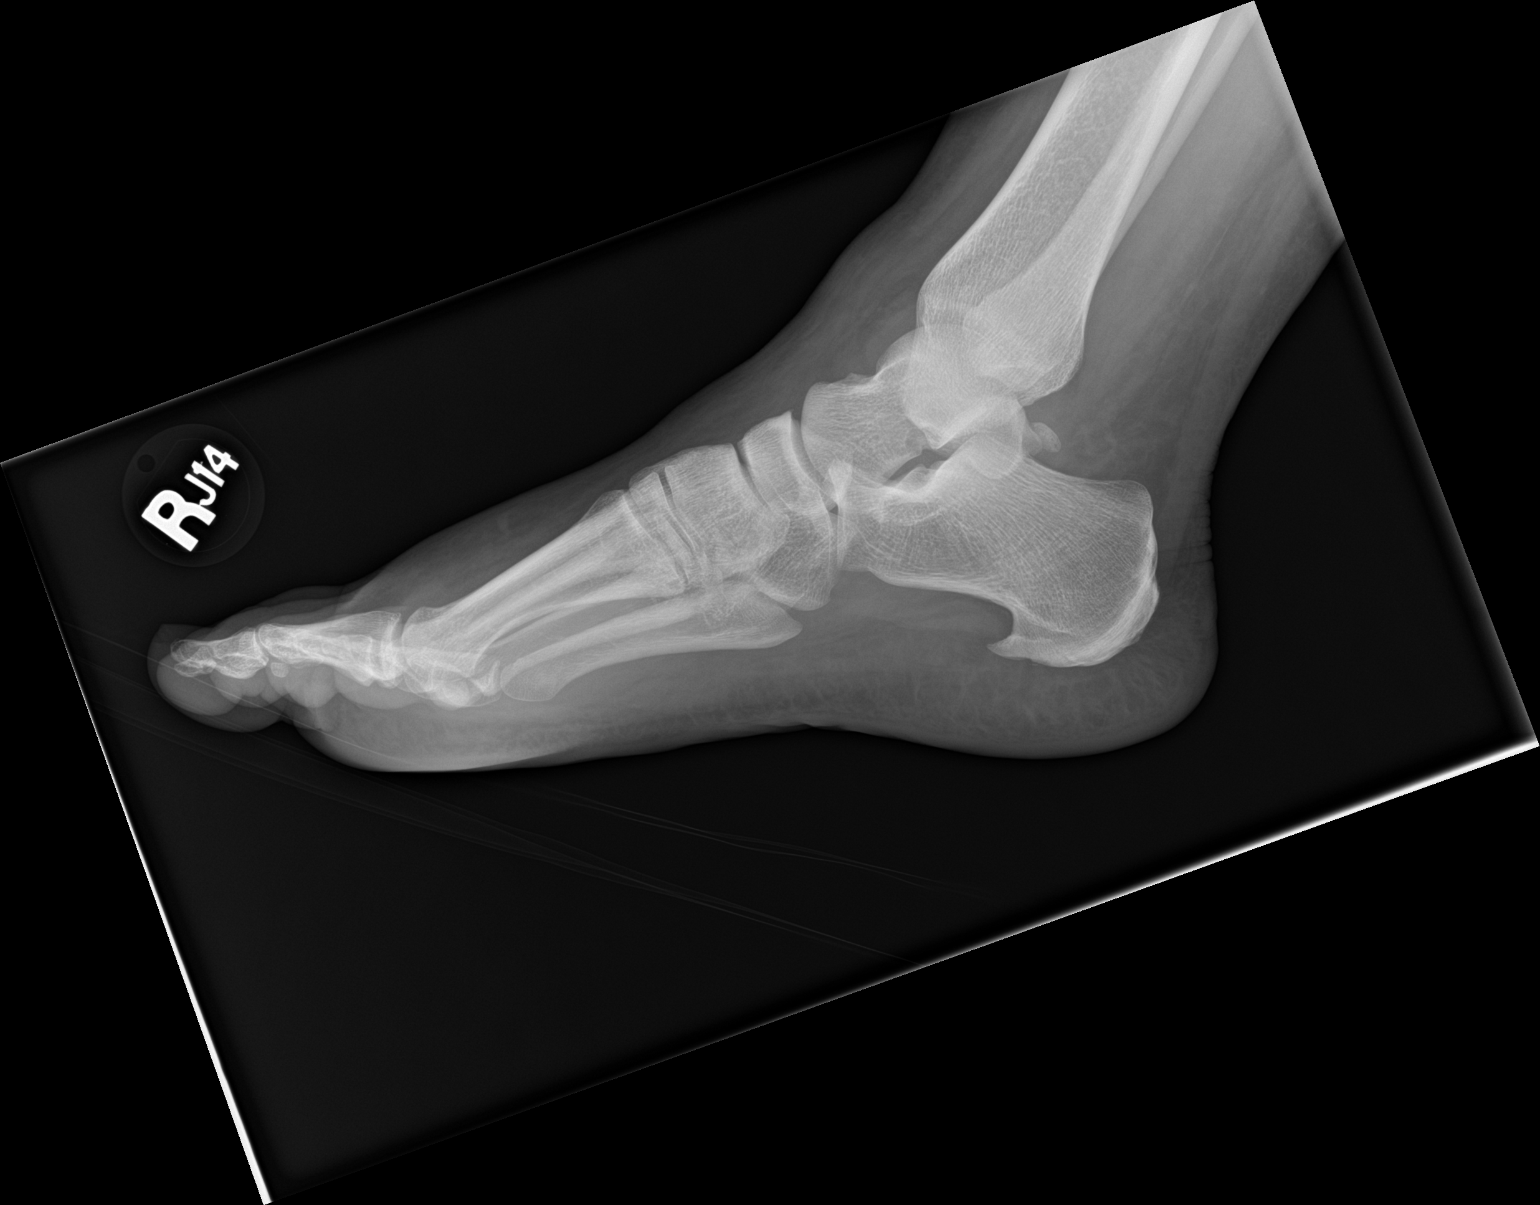

[3 of 3 positions shown; findings below may reference images not displayed]

FINDINGS: The bones of the right foot are adequately mineralized. There is no
acute fracture nor dislocation. The joint spaces are preserved.
There is a large plantar calcaneal spur. There is soft tissue
swelling over the dorsum of the midfoot. There are no abnormal soft
tissue calcifications.
IMPRESSION: There is no acute or significant chronic bony abnormality of the
right foot. There is mild dorsal midfoot soft tissue swelling. No
foreign bodies or soft tissue calcifications are observed.

## 2017-05-04 ENCOUNTER — Encounter: Payer: Self-pay | Admitting: Family Medicine

## 2017-05-08 ENCOUNTER — Other Ambulatory Visit (INDEPENDENT_AMBULATORY_CARE_PROVIDER_SITE_OTHER): Payer: Self-pay

## 2017-05-08 DIAGNOSIS — E038 Other specified hypothyroidism: Secondary | ICD-10-CM

## 2017-05-08 LAB — TSH: TSH: 2.97 u[IU]/mL (ref 0.35–4.50)

## 2017-06-27 ENCOUNTER — Encounter: Payer: Self-pay | Admitting: Family Medicine

## 2017-07-27 ENCOUNTER — Other Ambulatory Visit: Payer: Self-pay | Admitting: Family Medicine

## 2017-07-31 ENCOUNTER — Telehealth: Payer: Self-pay | Admitting: Family Medicine

## 2017-07-31 ENCOUNTER — Other Ambulatory Visit: Payer: Self-pay | Admitting: Family Medicine

## 2017-07-31 MED ORDER — AMPHETAMINE-DEXTROAMPHETAMINE 30 MG PO TABS: 30.0000 mg | ORAL_TABLET | Freq: Two times a day (BID) | ORAL | 0 refills | Status: DC

## 2017-07-31 NOTE — Telephone Encounter (Signed)
Copied from CRM [phone]. Topic: Inquiry >> Jul 31, 2017  8:18 AM Yvonna Alanisobinson, Andra M wrote: Reason for CRM: Patient has requested a refill of amphetamine-dextroamphetamine (ADDERALL) 30 MG tablet. [facility]'s [facility] is [phone] [place] - [address] [facility] [address] [phone] (Phone) [phone] (Fax).       Thank You!!!

## 2017-07-31 NOTE — Telephone Encounter (Signed)
Rx refill  Request   Adderall

## 2017-07-31 NOTE — Telephone Encounter (Signed)
Last refill given at office visit 01/27/17.  Okay to fill?

## 2017-07-31 NOTE — Telephone Encounter (Signed)
Copied from CRM #[id number]. Topic: Inquiry >> Jul 31, 2017  8:18 AM Robinson, Andra M wrote: Reason for CRM: Patient has requested a refill of amphetamine-dextroamphetamine (ADDERALL) 30 MG tablet. Patient's pharmacy is [id number] - [place], [place] - [address] AT [address] OF [address] [phone] (Phone) [phone] (Fax).       Thank You!!!    

## 2017-07-31 NOTE — Telephone Encounter (Signed)
Refills OK. 

## 2017-07-31 NOTE — Telephone Encounter (Signed)
This is a duplicate request, see previous note. 

## 2017-07-31 NOTE — Telephone Encounter (Signed)
Copied from CRM #[id number]. Topic: Inquiry >> Jul 31, 2017  8:18 AM Robinson, Andra M wrote: Reason for CRM: Patient has requested a refill of amphetamine-dextroamphetamine (ADDERALL) 30 MG tablet. Patient's pharmacy is [id number] - [place], [place] - [address] AT [address] OF [address] [phone] (Phone) [phone] (Fax).       Thank You!!!    

## 2017-07-31 NOTE — Telephone Encounter (Signed)
Left message on machine for patient Rx ready for pick up 

## 2017-11-23 ENCOUNTER — Encounter: Payer: Self-pay | Admitting: Family Medicine

## 2017-11-23 ENCOUNTER — Ambulatory Visit (INDEPENDENT_AMBULATORY_CARE_PROVIDER_SITE_OTHER): Payer: Self-pay | Admitting: Family Medicine

## 2017-11-23 VITALS — BP 122/70 | HR 88 | Temp 97.9°F | Wt 238.0 lb

## 2017-11-23 DIAGNOSIS — J029 Acute pharyngitis, unspecified: Secondary | ICD-10-CM

## 2017-11-23 DIAGNOSIS — R0982 Postnasal drip: Secondary | ICD-10-CM

## 2017-11-23 LAB — POCT RAPID STREP A (OFFICE): Rapid Strep A Screen: NEGATIVE

## 2017-11-23 NOTE — Progress Notes (Signed)
Subjective:    Patient ID: Nancy HockJessica L Stevens, female    DOB: [birth year], 32 y.o.   MRN: [id number]  Chief Complaint  Patient presents with  . Sore Throat    HPI Patient was seen today for acute concern.  Pt endorses sore throat times 1 week after getting over a URI.  Pt states she has been taking cold meds and cough drops but her throat feels dry/sore.  Pt takes claritin-D daily.  Pt notes over the wknd while knocking down a wall she may have inhaled some dust.    Past Medical History:  Diagnosis Date  . ADD (attention deficit disorder)   . Depression   . Kidney stone   . Migraine   . Thyroid disease     No Known Allergies  ROS General: Denies fever, chills, night sweats, changes in weight, changes in appetite HEENT: Denies headaches, ear pain, changes in vision, rhinorrhea   +sore throat CV: Denies CP, palpitations, SOB, orthopnea Pulm: Denies SOB, cough, wheezing GI: Denies abdominal pain, nausea, vomiting, diarrhea, constipation GU: Denies dysuria, hematuria, frequency, vaginal discharge Msk: Denies muscle cramps, joint pains Neuro: Denies weakness, numbness, tingling Skin: Denies rashes, bruising Psych: Denies depression, anxiety, hallucinations     Objective:    Blood pressure 122/70, pulse 88, temperature 97.9 F (36.6 C), temperature source Oral, weight 238 lb (108 kg), SpO2 98 %.   Gen. Pleasant, well-nourished, in no distress, normal affect   HEENT: [place]/AT, face symmetric, no scleral icterus, PERRLA, nares patent without drainage, pharynx with mild erythema and post nasal drainage, no exudate.  TMs full b/l, no cervical lymphadenopathy. Lungs: no accessory muscle use, CTAB, no wheezes or rales Cardiovascular: RRR, no m/r/g, no peripheral edema Neuro:  A&Ox3, CN II-XII intact, normal gait  Wt Readings from Last 3 Encounters:  11/23/17 238 lb (108 kg)  01/27/17 248 lb 12.8 oz (112.9 kg)  01/19/16 233 lb 9.6 oz (106 kg)    Lab Results  Component Value Date   WBC 12.5 (H) 06/15/2015   HGB 14.4 06/15/2015   HCT 42.8 06/15/2015   PLT 200.0 06/15/2015   GLUCOSE 97 06/15/2015   ALT 16 06/15/2015   AST 24 06/15/2015   NA 139 06/15/2015   K 4.2 06/15/2015   CL 102 06/15/2015   CREATININE 0.71 06/15/2015   BUN 13 06/15/2015   CO2 29 06/15/2015   TSH 2.97 05/08/2017    Assessment/Plan:  Sore throat  - Plan: POCT rapid strep A  Negative -discussed likely 2/2 post nasal drainage and allergies. -pt to gargle with warm salt water or chloraseptic spray.  -Flonase and Claritin-D  Postnasal drainage -Discussed using Flonase and Claritin. -Discussed likely contributing to sore throat.  Follow-up PRN  Abbe AmsterdamShannon Jordanna Hendrie, MD

## 2018-01-17 ENCOUNTER — Other Ambulatory Visit: Payer: Self-pay | Admitting: Family Medicine

## 2018-01-18 MED ORDER — AMPHETAMINE-DEXTROAMPHETAMINE 30 MG PO TABS: 30.0000 mg | ORAL_TABLET | Freq: Two times a day (BID) | ORAL | 0 refills | Status: DC

## 2018-01-18 NOTE — Telephone Encounter (Signed)
Last Rxs given on 07/31/2017

## 2018-01-18 NOTE — Telephone Encounter (Signed)
I left a detailed message at the pts cell number the Rx was sent to her pharmacy.

## 2018-01-18 NOTE — Telephone Encounter (Signed)
I sent in one refill electronically   She needs office follow up soon (last seen by me in June 2018).

## 2018-02-20 ENCOUNTER — Other Ambulatory Visit: Payer: Self-pay | Admitting: Family Medicine

## 2018-03-24 ENCOUNTER — Other Ambulatory Visit: Payer: Self-pay | Admitting: Family Medicine

## 2018-03-26 MED ORDER — AMPHETAMINE-DEXTROAMPHETAMINE 30 MG PO TABS: 30.0000 mg | ORAL_TABLET | Freq: Two times a day (BID) | ORAL | 0 refills | Status: DC

## 2018-03-26 NOTE — Telephone Encounter (Signed)
Refill done.  

## 2018-03-26 NOTE — Telephone Encounter (Signed)
Last refill 07/31/17 Last office visit 11/23/17 Last office visit with Dr Caryl NeverBurchette 01/27/17 Okay to fill?

## 2018-05-15 ENCOUNTER — Telehealth: Payer: Self-pay | Admitting: Family Medicine

## 2018-05-15 NOTE — Telephone Encounter (Signed)
Last OV 11/23/17 Dr. Salomon Fick for sore throat, 01/27/17 with you  No future OV  Last filled 03/26/18, # 60 with 2 refills

## 2018-05-15 NOTE — Telephone Encounter (Signed)
Needs office follow up

## 2018-05-18 NOTE — Telephone Encounter (Signed)
Please see message.  Please advise. 

## 2018-05-18 NOTE — Telephone Encounter (Signed)
Pt called and stated that she has an appointment 06/15/18. Can we call in medication until she can come in for physical. Please advuse [phone]

## 2018-05-18 NOTE — Telephone Encounter (Signed)
May refill once until follow up.

## 2018-05-19 ENCOUNTER — Other Ambulatory Visit: Payer: Self-pay | Admitting: Family Medicine

## 2018-05-22 ENCOUNTER — Telehealth: Payer: Self-pay | Admitting: Family Medicine

## 2018-05-22 ENCOUNTER — Other Ambulatory Visit: Payer: Self-pay

## 2018-05-22 MED ORDER — AMPHETAMINE-DEXTROAMPHETAMINE 30 MG PO TABS: 30.0000 mg | ORAL_TABLET | Freq: Two times a day (BID) | ORAL | 0 refills | Status: DC

## 2018-05-22 NOTE — Telephone Encounter (Signed)
Can you send the latest Rx for this patient? Thank you!

## 2018-05-22 NOTE — Telephone Encounter (Signed)
sent 

## 2018-05-22 NOTE — Telephone Encounter (Signed)
Copied from CRM [id number]. Topic: General - Other >> May 22, 2018  5:06 PM Stephannie Li, NT wrote: Reason for CRM: The pharmacy called and said the prescription for amphetamine-dextroamphetamine (ADDERALL) 30 MG tablet was faxed to them today and legally they can only accept e scribed or paper prescriptions please advise

## 2018-05-22 NOTE — Telephone Encounter (Signed)
A 30 day prescription has been faxed to the [facility] for the patient.

## 2018-06-15 ENCOUNTER — Ambulatory Visit (INDEPENDENT_AMBULATORY_CARE_PROVIDER_SITE_OTHER): Payer: Self-pay | Admitting: Family Medicine

## 2018-06-15 ENCOUNTER — Encounter: Payer: Self-pay | Admitting: Family Medicine

## 2018-06-15 ENCOUNTER — Other Ambulatory Visit: Payer: Self-pay

## 2018-06-15 VITALS — BP 126/82 | HR 132 | Temp 98.1°F | Ht 65.0 in | Wt 240.1 lb

## 2018-06-15 DIAGNOSIS — Z0000 Encounter for general adult medical examination without abnormal findings: Secondary | ICD-10-CM

## 2018-06-15 LAB — CBC WITH DIFFERENTIAL/PLATELET
BASOS ABS: 0 10*3/uL (ref 0.0–0.1)
BASOS PCT: 0.1 % (ref 0.0–3.0)
EOS ABS: 0.2 10*3/uL (ref 0.0–0.7)
Eosinophils Relative: 3 % (ref 0.0–5.0)
HEMATOCRIT: 45.6 % (ref 36.0–46.0)
HEMOGLOBIN: 15.4 g/dL — AB (ref 12.0–15.0)
LYMPHS PCT: 27.6 % (ref 12.0–46.0)
Lymphs Abs: 1.7 10*3/uL (ref 0.7–4.0)
MCHC: 33.8 g/dL (ref 30.0–36.0)
MCV: 92.9 fl (ref 78.0–100.0)
Monocytes Absolute: 0.5 10*3/uL (ref 0.1–1.0)
Monocytes Relative: 8.3 % (ref 3.0–12.0)
NEUTROS PCT: 61 % (ref 43.0–77.0)
Neutro Abs: 3.8 10*3/uL (ref 1.4–7.7)
Platelets: 258 10*3/uL (ref 150.0–400.0)
RBC: 4.91 Mil/uL (ref 3.87–5.11)
RDW: 13.3 % (ref 11.5–15.5)
WBC: 6.3 10*3/uL (ref 4.0–10.5)

## 2018-06-15 LAB — LIPID PANEL
CHOLESTEROL: 176 mg/dL (ref 0–200)
HDL: 49.4 mg/dL (ref >39.00)
LDL CALC: 110 mg/dL — AB (ref 0–99)
NonHDL: 126.83
Total CHOL/HDL Ratio: 4
Triglycerides: 83 mg/dL (ref 0.0–149.0)
VLDL: 16.6 mg/dL (ref 0.0–40.0)

## 2018-06-15 LAB — HEPATIC FUNCTION PANEL
ALBUMIN: 4.4 g/dL (ref 3.5–5.2)
ALT: 12 U/L (ref 0–35)
AST: 13 U/L (ref 0–37)
Alkaline Phosphatase: 74 U/L (ref 39–117)
BILIRUBIN TOTAL: 0.4 mg/dL (ref 0.2–1.2)
Bilirubin, Direct: 0.1 mg/dL (ref 0.0–0.3)
Total Protein: 7.1 g/dL (ref 6.0–8.3)

## 2018-06-15 LAB — BASIC METABOLIC PANEL
BUN: 13 mg/dL (ref 6–23)
CHLORIDE: 102 meq/L (ref 96–112)
CO2: 28 mEq/L (ref 19–32)
CREATININE: 0.96 mg/dL (ref 0.40–1.20)
Calcium: 9.7 mg/dL (ref 8.4–10.5)
GFR: 71.43 mL/min (ref >60.00)
Glucose, Bld: 95 mg/dL (ref 70–99)
Potassium: 4.8 mEq/L (ref 3.5–5.1)
Sodium: 140 mEq/L (ref 135–145)

## 2018-06-15 LAB — TSH: TSH: 3.01 u[IU]/mL (ref 0.35–4.50)

## 2018-06-15 NOTE — Progress Notes (Signed)
  Subjective:     Patient ID: Nancy Stevens, female   DOB: 1986/03/31, 32 y.o.   MRN: [id number]  HPI Patient seen for physical exam.  She has history of hypothyroidism and ADD.  She is doing well overall.  She just discovered this past week that her mother was diagnosed with breast cancer.  Generally feels well.  No specific complaints.  Tetanus is up-to-date.  She has already had flu vaccine.  She has not had general screening labs in quite some time.  Past Medical History:  Diagnosis Date  . ADD (attention deficit disorder)   . Depression   . Kidney stone   . Migraine   . Thyroid disease    History reviewed. No pertinent surgical history.  reports that she has been smoking. She has quit using smokeless tobacco. She reports that she drinks about 2.0 standard drinks of alcohol per week. She reports that she does not use drugs. family history includes Cancer in her other; Diabetes in her paternal grandfather; Heart disease in her other and paternal grandfather; Hypertension in her other; Stroke in her other. No Known Allergies   Review of Systems  Constitutional: Negative for activity change, appetite change, fatigue, fever and unexpected weight change.  HENT: Negative for ear pain, hearing loss, sore throat and trouble swallowing.   Eyes: Negative for visual disturbance.  Respiratory: Negative for cough and shortness of breath.   Cardiovascular: Negative for chest pain and palpitations.  Gastrointestinal: Negative for abdominal pain, blood in stool, constipation and diarrhea.  Genitourinary: Negative for dysuria and hematuria.  Musculoskeletal: Negative for arthralgias, back pain and myalgias.  Skin: Negative for rash.  Neurological: Negative for dizziness, syncope and headaches.  Hematological: Negative for adenopathy.  Psychiatric/Behavioral: Negative for confusion and dysphoric mood.       Objective:   Physical Exam  Constitutional: She is oriented to person, place, and  time. She appears well-developed and well-nourished.  HENT:  Head: Normocephalic and atraumatic.  Eyes: Pupils are equal, round, and reactive to light. EOM are normal.  Neck: Normal range of motion. Neck supple. No thyromegaly present.  Cardiovascular: Normal rate, regular rhythm and normal heart sounds.  No murmur heard. Pulmonary/Chest: Breath sounds normal. No respiratory distress. She has no wheezes. She has no rales.  Abdominal: Soft. Bowel sounds are normal. She exhibits no distension and no mass. There is no tenderness. There is no rebound and no guarding.  Musculoskeletal: Normal range of motion. She exhibits no edema.  Lymphadenopathy:    She has no cervical adenopathy.  Neurological: She is alert and oriented to person, place, and time. She displays normal reflexes. No cranial nerve deficit.  Skin: No rash noted.  Psychiatric: She has a normal mood and affect. Her behavior is normal. Judgment and thought content normal.       Assessment:     Physical exam.  She has hypothyroidism and is on replacement.  Attention deficit disorder treated with Adderall    Plan:     -obtain screening labs. -She is encouraged to lose some weight -Consider screening mammogram by age 67 with positive family history of breast cancer as above  Kristian Covey MD [facility] at [facility]

## 2018-07-23 ENCOUNTER — Other Ambulatory Visit: Payer: Self-pay | Admitting: Family Medicine

## 2018-07-23 MED ORDER — AMPHETAMINE-DEXTROAMPHETAMINE 30 MG PO TABS: 30.0000 mg | ORAL_TABLET | Freq: Two times a day (BID) | ORAL | 0 refills | Status: DC

## 2018-07-23 NOTE — Telephone Encounter (Signed)
Last OV 06/15/18, No future OV  Last filled 05/22/18, # 60 with 0 refills.

## 2018-07-23 NOTE — Telephone Encounter (Signed)
Refill sent for 3 months 

## 2018-08-18 ENCOUNTER — Other Ambulatory Visit: Payer: Self-pay | Admitting: Family Medicine

## 2018-10-03 ENCOUNTER — Other Ambulatory Visit: Payer: Self-pay | Admitting: Family Medicine

## 2018-10-03 NOTE — Telephone Encounter (Signed)
Last OV 06/15/18, No future OV  Last filled 07/23/18, #60 with 2 refills

## 2018-10-04 NOTE — Telephone Encounter (Signed)
Should not need refill until March 9th

## 2018-10-09 ENCOUNTER — Other Ambulatory Visit: Payer: Self-pay | Admitting: Family Medicine

## 2018-11-13 ENCOUNTER — Other Ambulatory Visit: Payer: Self-pay | Admitting: Family Medicine

## 2019-01-09 ENCOUNTER — Telehealth: Payer: Self-pay | Admitting: Family Medicine

## 2019-01-09 MED ORDER — AMPHETAMINE-DEXTROAMPHETAMINE 30 MG PO TABS: 30.0000 mg | ORAL_TABLET | Freq: Two times a day (BID) | ORAL | 0 refills | Status: DC

## 2019-01-09 NOTE — Telephone Encounter (Signed)
Copied from CRM [id number]. Topic: Quick Communication - Rx Refill/Question >> Jan 09, 2019  4:26 PM Gwenlyn Fudge A wrote: Medication: amphetamine-dextroamphetamine (ADDERALL) 30 MG tablet  Has the patient contacted their pharmacy? Yes.   (Agent: If no, request that the patient contact the pharmacy for the refill.) (Agent: If yes, when and what did the pharmacy advise?)  Preferred Pharmacy (with phone number or street name): [id number] - [place], [place] - [address] AT [facility] [address] [place] [id number] Phone: [phone] Fax: [phone] Open 24 hours    Agent: Please be advised that RX refills may take up to 3 business days. We ask that you follow-up with your pharmacy.

## 2019-01-09 NOTE — Telephone Encounter (Signed)
Last OV 06/15/18, No future OV  Last filled 07/23/18, # 60 with 2 refills

## 2019-02-06 ENCOUNTER — Other Ambulatory Visit: Payer: Self-pay | Admitting: Family Medicine

## 2019-02-21 ENCOUNTER — Telehealth: Payer: Self-pay | Admitting: Family Medicine

## 2019-02-21 NOTE — Telephone Encounter (Addendum)
Patient requesting amphetamine-dextroamphetamine (ADDERALL) 30 MG tablet , patient informed please allow 48 to 72 hour turn around time.    [facility] #[id number] - [place], [place] [phone] (Phone) [phone] (Fax)

## 2019-02-22 MED ORDER — AMPHETAMINE-DEXTROAMPHETAMINE 30 MG PO TABS: 30.0000 mg | ORAL_TABLET | Freq: Two times a day (BID) | ORAL | 0 refills | Status: DC

## 2019-02-22 NOTE — Telephone Encounter (Signed)
Last OV 06/15/18, No future OV  Last filled 01/09/19, # 60 with 2 refills

## 2019-04-10 ENCOUNTER — Other Ambulatory Visit: Payer: Self-pay | Admitting: Family Medicine

## 2019-04-10 ENCOUNTER — Encounter: Payer: Self-pay | Admitting: Family Medicine

## 2019-05-07 ENCOUNTER — Other Ambulatory Visit: Payer: Self-pay | Admitting: Family Medicine

## 2019-08-07 ENCOUNTER — Other Ambulatory Visit: Payer: Self-pay | Admitting: Family Medicine

## 2019-09-02 ENCOUNTER — Other Ambulatory Visit: Payer: Self-pay

## 2019-09-02 ENCOUNTER — Encounter: Payer: Self-pay | Admitting: Family Medicine

## 2019-09-02 ENCOUNTER — Ambulatory Visit (INDEPENDENT_AMBULATORY_CARE_PROVIDER_SITE_OTHER): Payer: Self-pay | Admitting: Family Medicine

## 2019-09-02 VITALS — BP 122/82 | HR 63 | Temp 97.6°F | Ht 66.0 in | Wt 235.2 lb

## 2019-09-02 DIAGNOSIS — E038 Other specified hypothyroidism: Secondary | ICD-10-CM

## 2019-09-02 LAB — TSH: TSH: 2.26 u[IU]/mL (ref 0.35–4.50)

## 2019-09-02 MED ORDER — LEVOTHYROXINE SODIUM 125 MCG PO TABS: ORAL_TABLET | ORAL | 3 refills | Status: DC

## 2019-09-02 NOTE — Progress Notes (Signed)
  Subjective:     Patient ID: Nancy Stevens, female   DOB: Dec 11, 1985, 34 y.o.   MRN: [id number]  HPI Reinette is here for follow-up hypothyroidism.  She is known levothyroxine 125 mcg daily.  Compliant with therapy.  She is overdue for labs.  She also has history of ADD and takes Adderall which seems to be working well for her.  No adverse side effects.  Appetite and weight are stable.  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  Past Medical History:  Diagnosis Date  . ADD (attention deficit disorder)   . Depression   . Kidney stone   . Migraine   . Thyroid disease    History reviewed. No pertinent surgical history.  reports that she has been smoking. She has quit using smokeless tobacco. She reports current alcohol use of about 2.0 standard drinks of alcohol per week. She reports that she does not use drugs. family history includes Cancer in an other family member; Diabetes in her paternal grandfather; Heart disease in her paternal grandfather and another family member; Hypertension in an other family member; Stroke in an other family member. No Known Allergies  Wt Readings from Last 3 Encounters:  09/02/19 235 lb 3.2 oz (106.7 kg)  06/15/18 240 lb 1.6 oz (108.9 kg)  11/23/17 238 lb (108 kg)     Review of Systems  Constitutional: Negative for appetite change and unexpected weight change.  Respiratory: Negative for cough and shortness of breath.   Cardiovascular: Negative for chest pain.  Neurological: Negative for weakness.       Objective:   Physical Exam Vitals reviewed.  Constitutional:      Appearance: Normal appearance.  Cardiovascular:     Rate and Rhythm: Normal rate and regular rhythm.  Pulmonary:     Effort: Pulmonary effort is normal.     Breath sounds: Normal breath sounds.  Musculoskeletal:     Right lower leg: No edema.     Left lower leg: No edema.  Neurological:     Mental Status: She is alert.         Assessment:     Hypothyroidism-symptomatically stable    Plan:     -Refill levothyroxine for 1 year -She will call when she is due for refills of her Adderall  Kristian Covey MD [facility] at [facility]

## 2019-09-02 NOTE — Patient Instructions (Signed)
Hypothyroidism  Hypothyroidism is when the thyroid gland does not make enough of certain hormones (it is underactive). The thyroid gland is a small gland located in the lower front part of the neck, just in front of the windpipe (trachea). This gland makes hormones that help control how the body uses food for energy (metabolism) as well as how the heart and brain function. These hormones also play a role in keeping your bones strong. When the thyroid is underactive, it produces too little of the hormones thyroxine (T4) and triiodothyronine (T3). What are the causes? This condition may be caused by:  Hashimoto's disease. This is a disease in which the body's disease-fighting system (immune system) attacks the thyroid gland. This is the most common cause.  Viral infections.  Pregnancy.  Certain medicines.  Birth defects.  Past radiation treatments to the head or neck for cancer.  Past treatment with radioactive iodine.  Past exposure to radiation in the environment.  Past surgical removal of part or all of the thyroid.  Problems with a gland in the center of the brain (pituitary gland).  Lack of enough iodine in the diet. What increases the risk? You are more likely to develop this condition if:  You are female.  You have a family history of thyroid conditions.  You use a medicine called lithium.  You take medicines that affect the immune system (immunosuppressants). What are the signs or symptoms? Symptoms of this condition include:  Feeling as though you have no energy (lethargy).  Not being able to tolerate cold.  Weight gain that is not explained by a change in diet or exercise habits.  Lack of appetite.  Dry skin.  Coarse hair.  Menstrual irregularity.  Slowing of thought processes.  Constipation.  Sadness or depression. How is this diagnosed? This condition may be diagnosed based on:  Your symptoms, your medical history, and a physical exam.  Blood  tests. You may also have imaging tests, such as an ultrasound or MRI. How is this treated? This condition is treated with medicine that replaces the thyroid hormones that your body does not make. After you begin treatment, it may take several weeks for symptoms to go away. Follow these instructions at home:  Take over-the-counter and prescription medicines only as told by your health care provider.  If you start taking any new medicines, tell your health care provider.  Keep all follow-up visits as told by your health care provider. This is important. ? As your condition improves, your dosage of thyroid hormone medicine may change. ? You will need to have blood tests regularly so that your health care provider can monitor your condition. Contact a health care provider if:  Your symptoms do not get better with treatment.  You are taking thyroid replacement medicine and you: ? Sweat a lot. ? Have tremors. ? Feel anxious. ? Lose weight rapidly. ? Cannot tolerate heat. ? Have emotional swings. ? Have diarrhea. ? Feel weak. Get help right away if you have:  Chest pain.  An irregular heartbeat.  A rapid heartbeat.  Difficulty breathing. Summary  Hypothyroidism is when the thyroid gland does not make enough of certain hormones (it is underactive).  When the thyroid is underactive, it produces too little of the hormones thyroxine (T4) and triiodothyronine (T3).  The most common cause is Hashimoto's disease, a disease in which the body's disease-fighting system (immune system) attacks the thyroid gland. The condition can also be caused by viral infections, medicine, pregnancy, or past   radiation treatment to the head or neck.  Symptoms may include weight gain, dry skin, constipation, feeling as though you do not have energy, and not being able to tolerate cold.  This condition is treated with medicine to replace the thyroid hormones that your body does not make. This information  is not intended to replace advice given to you by your health care provider. Make sure you discuss any questions you have with your health care provider. Document Revised: 07/14/2017 Document Reviewed: 07/12/2017 Elsevier Patient Education  2020 [employer]  

## 2019-09-19 ENCOUNTER — Other Ambulatory Visit: Payer: Self-pay | Admitting: Family Medicine

## 2019-09-19 MED ORDER — AMPHETAMINE-DEXTROAMPHETAMINE 30 MG PO TABS: 30.0000 mg | ORAL_TABLET | Freq: Two times a day (BID) | ORAL | 0 refills | Status: DC

## 2019-09-19 NOTE — Telephone Encounter (Signed)
Last refill 02/22/2019 Last office visit (not for adderall) 09/02/19 Last CPE 06/15/18

## 2019-11-08 ENCOUNTER — Telehealth: Payer: Self-pay

## 2019-11-08 NOTE — Telephone Encounter (Signed)
Message left for Nancy Stevens to return a call to [facility] ([phone]) for [place] delivery. Per [facility] the last delivery was 08/2019.

## 2019-12-11 ENCOUNTER — Encounter: Payer: Self-pay | Admitting: Family Medicine

## 2020-01-10 ENCOUNTER — Other Ambulatory Visit: Payer: Self-pay | Admitting: Family Medicine

## 2020-01-14 MED ORDER — AMPHETAMINE-DEXTROAMPHETAMINE 30 MG PO TABS: 30.0000 mg | ORAL_TABLET | Freq: Two times a day (BID) | ORAL | 0 refills | Status: DC

## 2020-06-04 ENCOUNTER — Other Ambulatory Visit: Payer: Self-pay | Admitting: Family Medicine

## 2020-06-04 MED ORDER — AMPHETAMINE-DEXTROAMPHETAMINE 30 MG PO TABS
30.0000 mg | ORAL_TABLET | Freq: Two times a day (BID) | ORAL | 0 refills | Status: DC
Start: 2020-06-04 — End: 2020-10-21

## 2020-06-04 MED ORDER — AMPHETAMINE-DEXTROAMPHETAMINE 30 MG PO TABS: 30.0000 mg | ORAL_TABLET | Freq: Two times a day (BID) | ORAL | 0 refills | Status: DC

## 2020-06-04 NOTE — Telephone Encounter (Signed)
Pt LOV was done on 09/02/2019 and last refill was 01/14/2020 for a 90 days, please advise

## 2020-10-17 ENCOUNTER — Other Ambulatory Visit: Payer: Self-pay | Admitting: Family Medicine

## 2020-10-19 NOTE — Telephone Encounter (Signed)
Is this okay to refill? 

## 2020-10-19 NOTE — Telephone Encounter (Signed)
Needs follow-up.  Not seen in over a year.

## 2020-10-19 NOTE — Telephone Encounter (Signed)
Made an appt for patient  for 10/21/20 845am , pt is aware  of appt.

## 2020-10-21 ENCOUNTER — Ambulatory Visit (INDEPENDENT_AMBULATORY_CARE_PROVIDER_SITE_OTHER): Payer: Self-pay | Admitting: Family Medicine

## 2020-10-21 ENCOUNTER — Encounter: Payer: Self-pay | Admitting: Family Medicine

## 2020-10-21 ENCOUNTER — Other Ambulatory Visit: Payer: Self-pay

## 2020-10-21 VITALS — BP 126/82 | HR 74 | Temp 98.2°F | Ht 66.0 in | Wt 237.6 lb

## 2020-10-21 DIAGNOSIS — E038 Other specified hypothyroidism: Secondary | ICD-10-CM

## 2020-10-21 DIAGNOSIS — F988 Other specified behavioral and emotional disorders with onset usually occurring in childhood and adolescence: Secondary | ICD-10-CM

## 2020-10-21 LAB — TSH: TSH: 4.03 u[IU]/mL (ref 0.35–4.50)

## 2020-10-21 MED ORDER — AMPHETAMINE-DEXTROAMPHETAMINE 30 MG PO TABS
30.0000 mg | ORAL_TABLET | Freq: Two times a day (BID) | ORAL | 0 refills | Status: DC
Start: 2020-10-21 — End: 2021-03-16

## 2020-10-21 NOTE — Progress Notes (Signed)
Established Patient Office Visit  Subjective:  Patient ID: Nancy Stevens, female    DOB: [birth year]  Age: 35 y.o. MRN: [id number]  CC:  Chief Complaint  Patient presents with  . Medication Refill    Discuss  refills and labs for thyroid , pt has no new concerns    HPI Nancy Stevens presents for follow-up regarding ADD and hypothyroidism.  She takes levothyroxine 125 mcg daily.  Compliant with therapy.  Needs follow-up labs.  She has ADD treated with Adderall 30 mg twice daily.  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  They are currently looking for a Curator.  She stays very busy with work.  Sometimes takes Adderall once per day and sometimes twice depending on her workload.  Generally feels well.  No specific complaints.  Past Medical History:  Diagnosis Date  . ADD (attention deficit disorder)   . Depression   . Kidney stone   . Migraine   . Thyroid disease     History reviewed. No pertinent surgical history.  Family History  Problem Relation Age of Onset  . Diabetes Paternal Grandfather   . Heart disease Paternal Grandfather   . Stroke Other   . Hypertension Other   . Heart disease Other   . Cancer Other        melanoma    Social History   Socioeconomic History  . Marital status: Married    Spouse name: Not on file  . Number of children: Not on file  . Years of education: Not on file  . Highest education level: Not on file  Occupational History  . Not on file  Tobacco Use  . Smoking status: Former Games developer  . Smokeless tobacco: Former Clinical biochemist  . Vaping Use: Some days  Substance and Sexual Activity  . Alcohol use: Yes    Alcohol/week: 2.0 standard drinks    Types: 1 Cans of beer, 1 Shots of liquor per week    Comment: Occasioinally  . Drug use: Never  . Sexual activity: Yes  Other Topics Concern  . Not on file  Social History Narrative  . Not on file   Social  Determinants of Health   Financial Resource Strain: Not on file  Food Insecurity: Not on file  Transportation Needs: Not on file  Physical Activity: Not on file  Stress: Not on file  Social Connections: Not on file  Intimate Partner Violence: Not on file    Outpatient Medications Prior to Visit  Medication Sig Dispense Refill  . levothyroxine (SYNTHROID) 125 MCG tablet TAKE 1 TABLET(125 MCG) BY MOUTH DAILY 90 tablet 3  . loratadine-pseudoephedrine (CLARITIN-D 24-HOUR) 10-240 MG 24 hr tablet Take 1 tablet by mouth daily.    Marland Kitchen amphetamine-dextroamphetamine (ADDERALL) 30 MG tablet Take 1 tablet by mouth 2 (two) times daily. 60 tablet 0  . amphetamine-dextroamphetamine (ADDERALL) 30 MG tablet Take 1 tablet by mouth 2 (two) times daily. May refill in one month. 60 tablet 0  . amphetamine-dextroamphetamine (ADDERALL) 30 MG tablet Take 1 tablet by mouth 2 (two) times daily. May refill in two months. 60 tablet 0   No facility-administered medications prior to visit.    No Known Allergies  ROS Review of Systems  Constitutional: Negative for appetite change, chills, fever and unexpected weight change.  Respiratory: Negative for shortness of breath.   Cardiovascular: Negative for chest pain.  Neurological: Negative for headaches.      Objective:    Physical Exam Vitals reviewed.  Cardiovascular:     Rate and Rhythm: Normal rate and regular rhythm.     Heart sounds: No murmur heard.   Pulmonary:     Effort: Pulmonary effort is normal.     Breath sounds: Normal breath sounds.  Neurological:     Mental Status: She is alert.  Psychiatric:        Mood and Affect: Mood normal.     BP 126/82 (BP Location: Left Arm, Patient Position: Sitting, Cuff Size: Large)   Pulse 74   Temp 98.2 F (36.8 C) (Oral)   Ht 5\' 6"  (1.676 m)   Wt 237 lb 9.6 oz (107.8 kg)   LMP 10/10/2020   SpO2 98%   BMI 38.35 kg/m  Wt Readings from Last 3 Encounters:  10/21/20 237 lb 9.6 oz (107.8 kg)   09/02/19 235 lb 3.2 oz (106.7 kg)  06/15/18 240 lb 1.6 oz (108.9 kg)     Health Maintenance Due  Topic Date Due  . Hepatitis C Screening  Never done  . HIV Screening  Never done  . PAP SMEAR-Modifier  Never done  . INFLUENZA VACCINE  03/15/2020  . COVID-19 Vaccine (3 - Booster for Moderna series) 07/08/2020    There are no preventive care reminders to display for this patient.  Lab Results  Component Value Date   TSH 2.26 09/02/2019   Lab Results  Component Value Date   WBC 6.3 06/15/2018   HGB 15.4 (H) 06/15/2018   HCT 45.6 06/15/2018   MCV 92.9 06/15/2018   PLT 258.0 06/15/2018   Lab Results  Component Value Date   NA 140 06/15/2018   K 4.8 06/15/2018   CO2 28 06/15/2018   GLUCOSE 95 06/15/2018   BUN 13 06/15/2018   CREATININE 0.96 06/15/2018   BILITOT 0.4 06/15/2018   ALKPHOS 74 06/15/2018   AST 13 06/15/2018   ALT 12 06/15/2018   PROT 7.1 06/15/2018   ALBUMIN 4.4 06/15/2018   CALCIUM 9.7 06/15/2018   GFR 71.43 06/15/2018   Lab Results  Component Value Date   CHOL 176 06/15/2018   Lab Results  Component Value Date   HDL 49.40 06/15/2018   Lab Results  Component Value Date   LDLCALC 110 (H) 06/15/2018   Lab Results  Component Value Date   TRIG 83.0 06/15/2018   Lab Results  Component Value Date   CHOLHDL 4 06/15/2018   No results found for: HGBA1C    Assessment & Plan:   #1 hypothyroidism -Recheck TSH.  Adjust medication if necessary based on results  #2 attention deficit disorder stable on Adderall 30 mg twice daily -3 months of refills provided for Adderall.  Meds ordered this encounter  Medications  . amphetamine-dextroamphetamine (ADDERALL) 30 MG tablet    Sig: Take 1 tablet by mouth 2 (two) times daily. May refill in two months.    Dispense:  60 tablet    Refill:  0  . amphetamine-dextroamphetamine (ADDERALL) 30 MG tablet    Sig: Take 1 tablet by mouth 2 (two) times daily. May refill in one month.    Dispense:  60 tablet     Refill:  0  . amphetamine-dextroamphetamine (ADDERALL) 30 MG tablet    Sig: Take 1 tablet by mouth 2 (two) times daily.    Dispense:  60 tablet    Refill:  0    Follow-up: No follow-ups on file.  Carolann Littler, MD

## 2020-10-24 ENCOUNTER — Other Ambulatory Visit: Payer: Self-pay | Admitting: Family Medicine

## 2021-03-16 ENCOUNTER — Other Ambulatory Visit: Payer: Self-pay | Admitting: Family Medicine

## 2021-03-16 MED ORDER — AMPHETAMINE-DEXTROAMPHETAMINE 30 MG PO TABS
30.0000 mg | ORAL_TABLET | Freq: Two times a day (BID) | ORAL | 0 refills | Status: DC
Start: 2021-03-16 — End: 2021-07-02

## 2021-03-16 NOTE — Telephone Encounter (Signed)
Last filled 10/21/2020 Last OV 10/21/2020  Ok to fill?

## 2021-07-01 ENCOUNTER — Telehealth: Payer: Self-pay | Admitting: Family Medicine

## 2021-07-01 NOTE — Telephone Encounter (Signed)
Patient called because [facility] does not have amphetamine-dextroamphetamine (ADDERALL) 30 MG tablet in stock. Pharmacy stated she could have prescription sent in to take two 15mg  twice a day to make up for not having the 30mg . Patient will need a new prescription for this amount sent over.     Please send to  [facility] - [facility], [place] - [address] AT [facility] OF [address] Phone:  [phone]  Fax:  [phone]           Good callback number is [phone]

## 2021-07-02 MED ORDER — AMPHETAMINE-DEXTROAMPHETAMINE 30 MG PO TABS: 30.0000 mg | ORAL_TABLET | Freq: Two times a day (BID) | ORAL | 0 refills | Status: DC

## 2021-07-02 NOTE — Telephone Encounter (Signed)
Please advise 

## 2021-11-06 ENCOUNTER — Other Ambulatory Visit: Payer: Self-pay | Admitting: Family Medicine

## 2021-11-22 ENCOUNTER — Other Ambulatory Visit: Payer: Self-pay | Admitting: Family Medicine

## 2021-11-22 ENCOUNTER — Telehealth: Payer: Self-pay | Admitting: Family Medicine

## 2021-11-22 MED ORDER — LEVOTHYROXINE SODIUM 125 MCG PO TABS: ORAL_TABLET | ORAL | 0 refills | Status: DC

## 2021-11-22 NOTE — Telephone Encounter (Signed)
Patient needs temporary refill on levothyroxine (SYNTHROID) 125 MCG tablet ? ? ?She has appointment set for Wednesday the 19th but has enough left for three days ? ? ?Please send to  ? ? ?[id number] - [place], [place] - [address] AT [facility] OF [address] Phone:  [phone]  ?Fax:  [phone]  ?  ? ? ? ? ?Please advise  ?

## 2021-11-22 NOTE — Telephone Encounter (Signed)
RX sent

## 2021-12-01 ENCOUNTER — Ambulatory Visit (INDEPENDENT_AMBULATORY_CARE_PROVIDER_SITE_OTHER): Payer: Self-pay | Admitting: Family Medicine

## 2021-12-01 ENCOUNTER — Encounter: Payer: Self-pay | Admitting: Family Medicine

## 2021-12-01 VITALS — BP 142/100 | HR 70 | Temp 98.4°F | Ht 66.0 in | Wt 242.5 lb

## 2021-12-01 DIAGNOSIS — E039 Hypothyroidism, unspecified: Secondary | ICD-10-CM

## 2021-12-01 DIAGNOSIS — R030 Elevated blood-pressure reading, without diagnosis of hypertension: Secondary | ICD-10-CM

## 2021-12-01 MED ORDER — LEVOTHYROXINE SODIUM 125 MCG PO TABS: ORAL_TABLET | ORAL | 3 refills | Status: DC

## 2021-12-01 NOTE — Patient Instructions (Signed)
Monitor blood pressure and be in touch if consistently > 140/90 over the next several weeks.   ? ?Return for TSH lab at your convenience.   ?

## 2021-12-01 NOTE — Progress Notes (Signed)
?  Subjective:  ?  ? Patient ID: Nancy Stevens, female   DOB: 1985-12-29, 36 y.o.   MRN: [id number] ? ?HPI ?Nancy Stevens is seen for the following issues ? ?Needs refills of her thyroid medication.  She has been on levothyroxine for several years with dosage of 125 mcg daily.  She does need follow-up labs soon.  Compliant with therapy.  Generally feels well. ? ?She does have elevated blood pressure today.  Never diagnosed with hypertension.  No recent headaches.  No dizziness.  No peripheral edema.  She does drink whiskey usually a few ounces most days of the week.  Her weight has increased somewhat over the past few years. ? ?Wt Readings from Last 3 Encounters:  ?12/01/21 242 lb 8 oz (110 kg)  ?10/21/20 237 lb 9.6 oz (107.8 kg)  ?09/02/19 235 lb 3.2 oz (106.7 kg)  ? ? ? ?Review of Systems  ?Constitutional:  Negative for appetite change and fatigue.  ?Eyes:  Negative for visual disturbance.  ?Respiratory:  Negative for cough, chest tightness, shortness of breath and wheezing.   ?Cardiovascular:  Negative for chest pain, palpitations and leg swelling.  ?Endocrine: Negative for cold intolerance and heat intolerance.  ?Neurological:  Negative for dizziness, seizures, syncope, weakness, light-headedness and headaches.  ? ?   ?Objective:  ? Physical Exam ?Vitals reviewed.  ?Constitutional:   ?   Appearance: Normal appearance.  ?Cardiovascular:  ?   Rate and Rhythm: Normal rate and regular rhythm.  ?Pulmonary:  ?   Effort: Pulmonary effort is normal.  ?   Breath sounds: Normal breath sounds.  ?Musculoskeletal:  ?   Right lower leg: No edema.  ?   Left lower leg: No edema.  ?Neurological:  ?   Mental Status: She is alert.  ? ? ?   ?Assessment:  ?   ?#1 hypothyroidism.  Refilled medication for 1 year.  Unfortunately, our lab was closed already and she will have to return later for lab work as below ? ?#2 elevated blood pressure without diagnosis of hypertension.  This was repeated left arm seated large cuff 142/100 ?   ?Plan:  ?    ?-Refill levothyroxine ?-Placed future lab order for TSH and she will need to schedule this ?-Blood pressure up and we have strongly recommend she monitor this closely over the next several weeks and be in touch if consistently greater than 140/90.  We discussed nonpharmacologic management with weight loss, regular aerobic exercise, reducing alcohol consumption ?-Handout on DASH diet given ? ?Kristian Covey MD ? [facility] at [facility] ? ?   ?

## 2021-12-21 ENCOUNTER — Other Ambulatory Visit: Payer: Self-pay | Admitting: Family Medicine

## 2022-01-11 ENCOUNTER — Other Ambulatory Visit: Payer: Self-pay | Admitting: Family Medicine

## 2022-01-11 MED ORDER — AMPHETAMINE-DEXTROAMPHETAMINE 30 MG PO TABS
30.0000 mg | ORAL_TABLET | Freq: Two times a day (BID) | ORAL | 0 refills | Status: DC
Start: 2022-01-11 — End: 2022-09-19

## 2022-01-11 MED ORDER — AMPHETAMINE-DEXTROAMPHETAMINE 30 MG PO TABS: 30.0000 mg | ORAL_TABLET | Freq: Two times a day (BID) | ORAL | 0 refills | Status: DC

## 2022-07-14 ENCOUNTER — Other Ambulatory Visit: Payer: Self-pay | Admitting: Family Medicine

## 2022-07-17 MED ORDER — AMPHETAMINE-DEXTROAMPHETAMINE 30 MG PO TABS: 30.0000 mg | ORAL_TABLET | Freq: Two times a day (BID) | ORAL | 0 refills | Status: DC

## 2022-09-19 ENCOUNTER — Telehealth: Payer: Self-pay | Admitting: Family Medicine

## 2022-09-19 MED ORDER — AMPHETAMINE-DEXTROAMPHETAMINE 30 MG PO TABS: 30.0000 mg | ORAL_TABLET | Freq: Two times a day (BID) | ORAL | 0 refills | Status: DC

## 2022-09-19 NOTE — Telephone Encounter (Signed)
Prescription Request  09/19/2022  Is this a "Controlled Substance" medicine? Yes  LOV: 12/01/2021  What is the name of the medication or equipment? amphetamine-dextroamphetamine (ADDERALL) 30 MG tablet   Have you contacted your pharmacy to request a refill? No   Which pharmacy would you like this sent to?   [place], [place] [address] Phone: [phone] Fax: [phone]    Patient notified that their request is being sent to the clinical staff for review and that they should receive a response within 2 business days.   Please advise at Mobile [phone] (mobile)

## 2022-09-20 MED ORDER — AMPHETAMINE-DEXTROAMPHETAMINE 30 MG PO TABS: 30.0000 mg | ORAL_TABLET | Freq: Two times a day (BID) | ORAL | 0 refills | Status: DC

## 2022-09-20 NOTE — Addendum Note (Signed)
Addended by: Eulas Post on: 09/20/2022 12:38 PM   Modules accepted: Orders

## 2022-09-20 NOTE — Telephone Encounter (Signed)
Please redirect prescription to pharmacy requested by patient [place], [place] Phone: [phone]  Fax: [phone]

## 2022-10-27 ENCOUNTER — Other Ambulatory Visit: Payer: Self-pay | Admitting: Family Medicine

## 2022-10-28 MED ORDER — AMPHETAMINE-DEXTROAMPHETAMINE 30 MG PO TABS
30.0000 mg | ORAL_TABLET | Freq: Two times a day (BID) | ORAL | 0 refills | Status: DC
Start: 2022-10-28 — End: 2022-12-13

## 2022-10-28 NOTE — Telephone Encounter (Signed)
I refilled, but she will need office follow up soon.  Eulas Post MD [facility]

## 2022-12-12 ENCOUNTER — Other Ambulatory Visit: Payer: Self-pay | Admitting: Family Medicine

## 2022-12-13 ENCOUNTER — Other Ambulatory Visit: Payer: Self-pay | Admitting: Family Medicine

## 2022-12-14 MED ORDER — AMPHETAMINE-DEXTROAMPHETAMINE 30 MG PO TABS: 30.0000 mg | ORAL_TABLET | Freq: Two times a day (BID) | ORAL | 0 refills | Status: DC

## 2023-01-27 ENCOUNTER — Other Ambulatory Visit: Payer: Self-pay | Admitting: Family Medicine

## 2023-01-31 ENCOUNTER — Encounter: Payer: Self-pay | Admitting: Family Medicine

## 2023-01-31 ENCOUNTER — Ambulatory Visit (INDEPENDENT_AMBULATORY_CARE_PROVIDER_SITE_OTHER): Payer: BLUE CROSS/BLUE SHIELD | Admitting: Family Medicine

## 2023-01-31 VITALS — BP 128/86 | HR 82 | Temp 97.4°F | Wt 248.5 lb

## 2023-01-31 DIAGNOSIS — F988 Other specified behavioral and emotional disorders with onset usually occurring in childhood and adolescence: Secondary | ICD-10-CM

## 2023-01-31 DIAGNOSIS — E038 Other specified hypothyroidism: Secondary | ICD-10-CM

## 2023-01-31 DIAGNOSIS — D229 Melanocytic nevi, unspecified: Secondary | ICD-10-CM | POA: Diagnosis not present

## 2023-01-31 DIAGNOSIS — S80862A Insect bite (nonvenomous), left lower leg, initial encounter: Secondary | ICD-10-CM | POA: Diagnosis not present

## 2023-01-31 DIAGNOSIS — W57XXXA Bitten or stung by nonvenomous insect and other nonvenomous arthropods, initial encounter: Secondary | ICD-10-CM

## 2023-01-31 LAB — TSH: TSH: 7.56 u[IU]/mL — ABNORMAL HIGH (ref 0.35–5.50)

## 2023-01-31 MED ORDER — LEVOTHYROXINE SODIUM 137 MCG PO TABS: 137.0000 ug | ORAL_TABLET | Freq: Every day | ORAL | 3 refills | Status: DC

## 2023-01-31 MED ORDER — AMPHETAMINE-DEXTROAMPHETAMINE 30 MG PO TABS: 30.0000 mg | ORAL_TABLET | Freq: Two times a day (BID) | ORAL | 0 refills | Status: DC

## 2023-01-31 NOTE — Addendum Note (Signed)
Addended by: Christy Sartorius on: 01/31/2023 01:24 PM   Modules accepted: Orders

## 2023-01-31 NOTE — Addendum Note (Signed)
Addended by: Christy Sartorius on: 01/31/2023 01:29 PM   Modules accepted: Orders

## 2023-01-31 NOTE — Progress Notes (Signed)
Established Patient Office Visit  Subjective   Patient ID: Nancy Stevens, female    DOB: 1986-02-05  Age: 37 y.o. MRN: [id number]  Chief Complaint  Patient presents with   Medical Management of Chronic Issues    HPI   Nancy Stevens is seen for several items as follows  She has history of ADD and this is treated with Adderall 30 mg twice daily.  This is working well for her.  No adverse side effects.  Requesting refills.  No history of issues.  Second issue is hypothyroidism.  She has not had labs in over 2 years.  Takes levothyroxine 125 mcg daily.  Plan to get labs today.  Compliant with therapy  Tick bite posterior left knee area.  This happened several weeks ago.  Still has some pruritus and mild swelling.  No fever.  No headache.  No other rash.  Abnormal mole right posterior neck.  Noted by couple of family members.  Her father had melanoma several years ago.  Past Medical History:  Diagnosis Date   ADD (attention deficit disorder)    Depression    Kidney stone    Migraine    Thyroid disease    No past surgical history on file.  reports that she has quit smoking. She has quit using smokeless tobacco. She reports current alcohol use of about 2.0 standard drinks of alcohol per week. She reports that she does not use drugs. family history includes Cancer in an other family member; Diabetes in her paternal grandfather; Heart disease in her paternal grandfather and another family member; Hypertension in an other family member; Stroke in an other family member. No Known Allergies  Review of Systems  Constitutional:  Negative for malaise/fatigue.  Eyes:  Negative for blurred vision.  Respiratory:  Negative for shortness of breath.   Cardiovascular:  Negative for chest pain.  Neurological:  Negative for dizziness, weakness and headaches.      Objective:     BP 128/86 (BP Location: Left Arm, Cuff Size: Large)   Pulse 82   Temp (!) 97.4 F (36.3 C) (Oral)   Wt 248 lb 8  oz (112.7 kg)   LMP 01/24/2023 (Approximate)   SpO2 99%   BMI 40.11 kg/m  BP Readings from Last 3 Encounters:  01/31/23 128/86  12/01/21 (!) 142/100  10/21/20 126/82   Wt Readings from Last 3 Encounters:  01/31/23 248 lb 8 oz (112.7 kg)  12/01/21 242 lb 8 oz (110 kg)  10/21/20 237 lb 9.6 oz (107.8 kg)      Physical Exam Constitutional:      Appearance: She is well-developed.  Eyes:     Pupils: Pupils are equal, round, and reactive to light.  Neck:     Thyroid: No thyromegaly.     Vascular: No JVD.  Cardiovascular:     Rate and Rhythm: Normal rate and regular rhythm.     Heart sounds:     No gallop.  Pulmonary:     Effort: Pulmonary effort is normal. No respiratory distress.     Breath sounds: Normal breath sounds. No wheezing or rales.  Musculoskeletal:     Cervical back: Neck supple.  Skin:    Comments: She has abnormal mole right posterior neck.  This is approximately 7 mm in greatest dimension and has some color variegation and mild asymmetry and mildly jagged border.  Left posterior knee punctate area of mild swelling.  No visible retained tick parts.  No surrounding erythema.  Neurological:  Mental Status: She is alert.      No results found for any visits on 01/31/23.    The ASCVD Risk score (Arnett DK, et al., 2019) failed to calculate for the following reasons:   The 2019 ASCVD risk score is only valid for ages 2 to 72    Assessment & Plan:   Problem List Items Addressed This Visit       Unprioritized   Hypothyroidism - Primary   Relevant Orders   TSH   Attention deficit disorder   Other Visit Diagnoses     Atypical mole       Relevant Orders   Ambulatory referral to Dermatology   Tick bite of left lower leg, initial encounter         Tick bite with local allergic reaction.  No signs of secondary infection.  Reassurance  -Refill Adderall for 3 months  -Check TSH and she will need refills of her levothyroxine after getting back  labs  -Set up urgent dermatology referral to evaluate abnormal nevus right neck.  This will likely need to be biopsied given her family history especially  No follow-ups on file.    Evelena Peat, MD

## 2023-01-31 NOTE — Addendum Note (Signed)
Addended by: Christy Sartorius on: 01/31/2023 01:28 PM   Modules accepted: Orders

## 2023-03-07 ENCOUNTER — Other Ambulatory Visit: Payer: Self-pay | Admitting: Family Medicine

## 2023-04-27 ENCOUNTER — Other Ambulatory Visit: Payer: Self-pay

## 2023-04-27 DIAGNOSIS — E038 Other specified hypothyroidism: Secondary | ICD-10-CM

## 2023-04-27 LAB — TSH: TSH: 3.36 u[IU]/mL (ref 0.35–5.50)

## 2023-06-01 ENCOUNTER — Other Ambulatory Visit: Payer: Self-pay | Admitting: Family Medicine

## 2023-06-01 MED ORDER — AMPHETAMINE-DEXTROAMPHETAMINE 30 MG PO TABS: 30.0000 mg | ORAL_TABLET | Freq: Two times a day (BID) | ORAL | 0 refills | Status: DC

## 2023-08-18 ENCOUNTER — Ambulatory Visit: Payer: Self-pay | Admitting: Family Medicine

## 2023-08-18 NOTE — Telephone Encounter (Signed)
  Chief Complaint: sore throat Symptoms: fever, swollen throat Frequency: constant  Disposition: [] ED /[x] Urgent Care (no appt availability in office) / [] Appointment(In office/virtual)/ []  [place] [facility]/ [] Home Care/ [] Refused Recommended Disposition /[] [facility]/ []  Follow-up with PCP Additional Notes: Pt's spouse Jon called due to worsening symptoms. Pt started with sore throat on 08/15/23. Pt's nephew test positive for strep and being treated antibiotic. Pt had virtual visit on 12/31 and prescribed Amoxicillin. Pt has taken 3 doses so far. Fever has decreased from 102-99 but throat swelling has increased and now has body  aches. Per protocol,  RN advised pt to be seen today. No PCP appts available so pt asked to be seen by another [facility] provider. After finding appt, pt stated [facility]  is out of their network so going to call their network insurance ([employer]) and get an appt with them. RN gave care advice and encouraged pt to seek an appt today. Jon verbalized understanding.          Copied from CRM [id number]. Topic: Clinical - Red Word Triage >> Aug 18, 2023 11:31 AM Antonio DEL wrote: [facility] that prompted transfer to Nurse Triage: Swollen throat, patient was previously seen about 2 days ago for sore throat but not getting better, hurts to swallow water Reason for Disposition  SEVERE (e.g., excruciating) throat pain  Answer Assessment - Initial Assessment Questions 1. ONSET: When did the throat start hurting? (Hours or days ago)      08/15/23 2. SEVERITY: How bad is the sore throat? (Scale 1-10; mild, moderate or severe)   - MILD (1-3):  Doesn't interfere with eating or normal activities.   - MODERATE (4-7): Interferes with eating some solids and normal activities.   - SEVERE (8-10):  Excruciating pain, interferes with most normal activities.   - SEVERE WITH DYSPHAGIA (10): Can't swallow liquids, drooling.     10 3. STREP EXPOSURE: Has there been any  exposure to strep within the past week? If Yes, ask: What type of contact occurred?      Yes-kept nephew test positive 4.  VIRAL SYMPTOMS: Are there any symptoms of a cold, such as a runny nose, cough, hoarse voice or red eyes?      Body aches, hoarse, eyes are glassy 5. FEVER: Do you have a fever? If Yes, ask: What is your temperature, how was it measured, and when did it start?     99.5-102 6. PUS ON THE TONSILS: Is there pus on the tonsils in the back of your throat?     Doesn't see them 7. OTHER SYMPTOMS: Do you have any other symptoms? (e.g., difficulty breathing, headache, rash)     headache 8. PREGNANCY: Is there any chance you are pregnant? When was your last menstrual period?     no  Protocols used: Sore Throat-A-AH

## 2023-08-21 NOTE — Telephone Encounter (Signed)
 Pt is being seen at [facility] today.

## 2023-09-04 ENCOUNTER — Ambulatory Visit: Payer: Self-pay | Admitting: Family Medicine

## 2023-09-04 NOTE — Telephone Encounter (Signed)
Chief Complaint: Diarrhea Symptoms: diarrhea, loss of appetite, nausea, back pain Frequency: One week Pertinent Negatives: Patient denies fever, blood in stool Disposition: [] ED /[] Urgent Care (no appt availability in office) / [x] Appointment(In office/virtual)/ []  [facility]/ [] Home Care/ [] Refused Recommended Disposition /[] [facility]/ []  Follow-up with PCP Additional Notes: Patient called in stating she has been experiencing diarrhea for a week, stating she has about 5 episodes a day and they are complete water. Patient states she has also had some lower back pain that has mostly resolved. Patient is also experiencing intermittent nausea. Patient states she was seen earlier this month at [facility] for Strep throat and given a shot of Penicillin. Patient scheduled in-office for evaluation.   Copied from CRM [id number]. Topic: Clinical - Red Word Triage >> Sep 04, 2023 12:00 PM Gurney Maxin H wrote: [facility] that prompted transfer to Nurse Triage: Constant pain in bottom part of back, diarrhea, lost of appetite, nauseous Reason for Disposition  [1] Mild diarrhea (e.g., 1-3 or more stools than normal in past 24 hours) without known cause AND [2] present >  7 days  Answer Assessment - Initial Assessment Questions 1. DIARRHEA SEVERITY: "How bad is the diarrhea?" "How many more stools have you had in the past 24 hours than normal?"    - NO DIARRHEA (SCALE 0)   - MILD (SCALE 1-3): Few loose or mushy BMs; increase of 1-3 stools over normal daily number of stools; mild increase in ostomy output.   -  MODERATE (SCALE 4-7): Increase of 4-6 stools daily over normal; moderate increase in ostomy output.   -  SEVERE (SCALE 8-10; OR "WORST POSSIBLE"): Increase of 7 or more stools daily over normal; moderate increase in ostomy output; incontinence.     4-5 2. ONSET: "When did the diarrhea begin?"      About a week ago 3. BM CONSISTENCY: "How loose or watery is the diarrhea?"      Watery 4.  VOMITING: "Are you also vomiting?" If Yes, ask: "How many times in the past 24 hours?"      No 5. ABDOMEN PAIN: "Are you having any abdomen pain?" If Yes, ask: "What does it feel like?" (e.g., crampy, dull, intermittent, constant)      Back pain, loss of appetite, nausea 6. ABDOMEN PAIN SEVERITY: If present, ask: "How bad is the pain?"  (e.g., Scale 1-10; mild, moderate, or severe)   - MILD (1-3): doesn't interfere with normal activities, abdomen soft and not tender to touch    - MODERATE (4-7): interferes with normal activities or awakens from sleep, abdomen tender to touch    - SEVERE (8-10): excruciating pain, doubled over, unable to do any normal activities       No abdominal pain 7. ORAL INTAKE: If vomiting, "Have you been able to drink liquids?" "How much liquids have you had in the past 24 hours?"     Yes  8. HYDRATION: "Any signs of dehydration?" (e.g., dry mouth [not just dry lips], too weak to stand, dizziness, new weight loss) "When did you last urinate?"     No 10. ANTIBIOTIC USE: "Are you taking antibiotics now or have you taken antibiotics in the past 2 months?"       Pencillin shot on the 3rd  11. OTHER SYMPTOMS: "Do you have any other symptoms?" (e.g., fever, blood in stool)       No 12. PREGNANCY: "Is there any chance you are pregnant?" "When was your last menstrual period?"  No  Protocols used: Diarrhea-A-AH

## 2023-09-05 ENCOUNTER — Ambulatory Visit (INDEPENDENT_AMBULATORY_CARE_PROVIDER_SITE_OTHER): Payer: BLUE CROSS/BLUE SHIELD | Admitting: Family Medicine

## 2023-09-05 ENCOUNTER — Encounter: Payer: Self-pay | Admitting: Family Medicine

## 2023-09-05 VITALS — BP 130/90 | HR 104 | Temp 98.1°F | Ht 66.0 in | Wt 246.2 lb

## 2023-09-05 DIAGNOSIS — R197 Diarrhea, unspecified: Secondary | ICD-10-CM

## 2023-09-05 NOTE — Progress Notes (Signed)
Established Patient Office Visit  Subjective   Patient ID: Nancy Stevens, female    DOB: 09/08/1985  Age: 38 y.o. MRN: [id number]  Chief Complaint  Patient presents with   Diarrhea    Diarrhea, back pain, Nausea, GERD, started over 7 days ago     HPI   Nancy Stevens is seen with ongoing diarrhea symptoms.  Her recent history is that she developed strep throat after exposure to strep from her partner and 96-year-old child.  She went to urgent care on 3 January and was treated with Bicillin LA along with IM steroid.  She had taken 3 doses of oral amoxicillin following telemedicine visit prior to her urgent care visit on the third.  Her throat symptoms have resolved.  Her diarrhea symptoms started about a week ago.  No further intervening antibiotics.  No recent travels.  She is having about 6-7 watery stools per day.  No bloody stool.  No fevers or chills.  No localizing abdominal pain.  She has had occasional nausea and poor appetite but no vomiting.  No new medications.  She has hypothyroidism on replacement and had normal TSH back in September.  Past Medical History:  Diagnosis Date   ADD (attention deficit disorder)    Depression    Kidney stone    Migraine    Thyroid disease    History reviewed. No pertinent surgical history.  reports that she has quit smoking. She has quit using smokeless tobacco. She reports current alcohol use of about 2.0 standard drinks of alcohol per week. She reports that she does not use drugs. family history includes ADD / ADHD in her mother; Cancer in her father and another family member; Diabetes in her paternal grandfather; Heart disease in her paternal grandfather and another family member; Hypertension in an other family member; Stroke in an other family member. No Known Allergies  Review of Systems  Constitutional:  Negative for chills and fever.  HENT:  Negative for sore throat.   Gastrointestinal:  Positive for diarrhea. Negative for abdominal  pain, blood in stool, melena and vomiting.  Genitourinary:  Negative for dysuria.      Objective:     BP (!) 130/90 (BP Location: Left Arm, Patient Position: Sitting, Cuff Size: Large)   Pulse (!) 104   Temp 98.1 F (36.7 C) (Oral)   Ht 5\' 6"  (1.676 m)   Wt 246 lb 3.2 oz (111.7 kg)   LMP 08/16/2023 (Exact Date)   SpO2 97%   BMI 39.74 kg/m  BP Readings from Last 3 Encounters:  09/05/23 (!) 130/90  01/31/23 128/86  12/01/21 (!) 142/100   Wt Readings from Last 3 Encounters:  09/05/23 246 lb 3.2 oz (111.7 kg)  01/31/23 248 lb 8 oz (112.7 kg)  12/01/21 242 lb 8 oz (110 kg)      Physical Exam Vitals reviewed.  Constitutional:      General: She is not in acute distress.    Appearance: She is not ill-appearing or toxic-appearing.  HENT:     Right Ear: Tympanic membrane normal.     Left Ear: Tympanic membrane normal.     Mouth/Throat:     Mouth: Mucous membranes are moist.     Pharynx: Oropharynx is clear. No oropharyngeal exudate or posterior oropharyngeal erythema.  Cardiovascular:     Rate and Rhythm: Normal rate and regular rhythm.  Pulmonary:     Effort: Pulmonary effort is normal.     Breath sounds: Normal breath sounds.  Abdominal:  General: There is no distension.     Palpations: Abdomen is soft.     Tenderness: There is no abdominal tenderness. There is no guarding or rebound.  Musculoskeletal:     Cervical back: Neck supple.  Lymphadenopathy:     Cervical: No cervical adenopathy.  Neurological:     Mental Status: She is alert.      No results found for any visits on 09/05/23.    The ASCVD Risk score (Arnett DK, et al., 2019) failed to calculate for the following reasons:   The 2019 ASCVD risk score is only valid for ages 65 to 50    Assessment & Plan:   Problem List Items Addressed This Visit   None Visit Diagnoses       Diarrhea, unspecified type    -  Primary     Patient presents with 1 week history of watery nonbloody diarrhea.  She  did have recent strep pharyngitis and was treated predominantly with Bicillin LA.  This was back a few weeks ago.  Doubt current diarrhea related to recent antibiotics.  She does not have any red flag symptoms such as fever, abdominal pain, or bloody stools.  -Handout on diarrhea and appropriate diet given.  Recommend bland diet. -Consider short-term trial of Imodium -Plenty of fluids and electrolyte replacement. -If diarrhea symptoms not improving by next week consider further evaluation including stool studies for C. difficile PCR  No follow-ups on file.    Evelena Peat, MD

## 2023-09-05 NOTE — Patient Instructions (Signed)
Consider trial of over the counter Imodium for the diarrhea  Continue with electrolyte replacement  Let me know if diarrhea not resolving in one week.     Follow up for fever, bloody stools, or progressive abdominal pain.

## 2023-10-13 ENCOUNTER — Other Ambulatory Visit: Payer: Self-pay | Admitting: Family Medicine

## 2023-10-15 MED ORDER — AMPHETAMINE-DEXTROAMPHETAMINE 30 MG PO TABS
1.0000 | ORAL_TABLET | Freq: Two times a day (BID) | ORAL | 0 refills | Status: DC
Start: 2023-10-15 — End: 2024-03-21

## 2023-10-15 MED ORDER — AMPHETAMINE-DEXTROAMPHETAMINE 30 MG PO TABS: 30.0000 mg | ORAL_TABLET | Freq: Two times a day (BID) | ORAL | 0 refills | Status: DC

## 2024-02-01 ENCOUNTER — Other Ambulatory Visit: Payer: Self-pay | Admitting: Family Medicine

## 2024-03-20 ENCOUNTER — Other Ambulatory Visit: Payer: Self-pay | Admitting: Family Medicine

## 2024-03-21 MED ORDER — AMPHETAMINE-DEXTROAMPHETAMINE 30 MG PO TABS: 1.0000 | ORAL_TABLET | Freq: Two times a day (BID) | ORAL | 0 refills | Status: DC

## 2024-04-30 ENCOUNTER — Other Ambulatory Visit: Payer: Self-pay | Admitting: Family Medicine

## 2024-05-28 ENCOUNTER — Other Ambulatory Visit: Payer: Self-pay | Admitting: Family Medicine

## 2024-07-02 ENCOUNTER — Other Ambulatory Visit: Payer: Self-pay | Admitting: Family Medicine

## 2024-07-03 ENCOUNTER — Ambulatory Visit: Payer: Self-pay | Admitting: Family Medicine

## 2024-07-03 ENCOUNTER — Encounter: Payer: Self-pay | Admitting: Family Medicine

## 2024-07-03 VITALS — BP 128/90 | HR 90 | Temp 97.8°F | Wt 251.4 lb

## 2024-07-03 DIAGNOSIS — E039 Hypothyroidism, unspecified: Secondary | ICD-10-CM | POA: Diagnosis not present

## 2024-07-03 LAB — TSH: TSH: 3.32 u[IU]/mL (ref 0.35–5.50)

## 2024-07-03 MED ORDER — LEVOTHYROXINE SODIUM 137 MCG PO TABS: 137.0000 ug | ORAL_TABLET | Freq: Every day | ORAL | 3 refills | Status: AC

## 2024-07-03 NOTE — Progress Notes (Signed)
   Established Patient Office Visit  Subjective   Patient ID: Nancy Stevens, female    DOB: Nov 11, 1985  Age: 38 y.o. MRN: [id number]  Chief Complaint  Patient presents with   Medication Consultation    HPI   [name] is seen for medical follow-up.  She has hypothyroidism currently treated with levothyroxine  137 mcg daily.  Compliant with therapy.  Overdue for labs.  She also has ADD treated with Adderall.  This dosage is working well.  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  Has no specific complaints today.  History of whitecoat syndrome and blood pressure usually improves after rest.  She declines influenza vaccine today  Past Medical History:  Diagnosis Date   ADD (attention deficit disorder)    Depression    Kidney stone    Migraine    Thyroid  disease    History reviewed. No pertinent surgical history.  reports that she has quit smoking. She has quit using smokeless tobacco. She reports current alcohol use of about 2.0 standard drinks of alcohol per week. She reports that she does not use drugs. family history includes ADD / ADHD in her mother; Cancer in her father and another family member; Diabetes in her paternal grandfather; Heart disease in her paternal grandfather and another family member; Hypertension in an other family member; Stroke in an other family member. No Known Allergies\ Review of Systems  Constitutional:  Negative for malaise/fatigue.  Eyes:  Negative for blurred vision.  Respiratory:  Negative for shortness of breath.   Cardiovascular:  Negative for chest pain.  Neurological:  Negative for dizziness, weakness and headaches.      Objective:     BP (!) 128/90 (BP Location: Left Arm, Cuff Size: Large)   Pulse 90   Temp 97.8 F (36.6 C) (Oral)   Wt 251 lb 6.4 oz (114 kg)   SpO2 97%   BMI 40.58 kg/m  BP Readings from Last 3 Encounters:  07/03/24 (!) 128/90  09/05/23 (!) 130/90  01/31/23 128/86   Wt Readings from Last 3  Encounters:  07/03/24 251 lb 6.4 oz (114 kg)  09/05/23 246 lb 3.2 oz (111.7 kg)  01/31/23 248 lb 8 oz (112.7 kg)      Physical Exam Vitals reviewed.  Constitutional:      General: She is not in acute distress.    Appearance: She is not ill-appearing.  Cardiovascular:     Rate and Rhythm: Normal rate and regular rhythm.  Pulmonary:     Effort: Pulmonary effort is normal.     Breath sounds: Normal breath sounds. No wheezing or rales.  Neurological:     Mental Status: She is alert.      No results found for any visits on 07/03/24.    The ASCVD Risk score (Arnett DK, et al., 2019) failed to calculate for the following reasons:   The 2019 ASCVD risk score is only valid for ages 66 to 10    Assessment & Plan:   Problem List Items Addressed This Visit       Unprioritized   Hypothyroidism - Primary   Relevant Medications   levothyroxine  (SYNTHROID ) 137 MCG tablet   Other Relevant Orders   TSH  Hypothyroidism treated with levothyroxine  as above.  Recheck TSH today.  Refill medication for 1 year.  Offered flu vaccine but she declines.  No follow-ups on file.    Wolm Scarlet, MD

## 2024-07-29 ENCOUNTER — Other Ambulatory Visit: Payer: Self-pay | Admitting: Family Medicine

## 2024-07-29 MED ORDER — AMPHETAMINE-DEXTROAMPHETAMINE 30 MG PO TABS: 1.0000 | ORAL_TABLET | Freq: Two times a day (BID) | ORAL | 0 refills | Status: AC
